# Patient Record
Sex: Male | Born: 1970 | Race: White | Hispanic: No | Marital: Married | Smoking: Never smoker
Health system: Southern US, Community
[De-identification: ages and names within clinical notes are randomized; demographics above are authoritative.]

## PROBLEM LIST (undated history)

## (undated) DIAGNOSIS — K219 Gastro-esophageal reflux disease without esophagitis: Secondary | ICD-10-CM

## (undated) DIAGNOSIS — F32A Depression, unspecified: Secondary | ICD-10-CM

## (undated) DIAGNOSIS — N289 Disorder of kidney and ureter, unspecified: Secondary | ICD-10-CM

## (undated) DIAGNOSIS — G4730 Sleep apnea, unspecified: Secondary | ICD-10-CM

## (undated) DIAGNOSIS — Z87442 Personal history of urinary calculi: Secondary | ICD-10-CM

## (undated) DIAGNOSIS — F419 Anxiety disorder, unspecified: Secondary | ICD-10-CM

## (undated) DIAGNOSIS — F329 Major depressive disorder, single episode, unspecified: Secondary | ICD-10-CM

## (undated) HISTORY — DX: Gastro-esophageal reflux disease without esophagitis: K21.9

## (undated) HISTORY — PX: VASECTOMY: SHX75

---

## 2009-10-30 ENCOUNTER — Ambulatory Visit: Payer: Self-pay | Admitting: General Practice

## 2009-12-03 ENCOUNTER — Emergency Department: Payer: Self-pay | Admitting: Unknown Physician Specialty

## 2009-12-12 ENCOUNTER — Ambulatory Visit: Payer: Self-pay | Admitting: General Practice

## 2017-08-01 ENCOUNTER — Emergency Department
Admission: EM | Admit: 2017-08-01 | Discharge: 2017-08-01 | Disposition: A | Discharge status: Home / Self Care | Payer: BLUE CROSS/BLUE SHIELD | Attending: Emergency Medicine | Admitting: Emergency Medicine

## 2017-08-01 ENCOUNTER — Other Ambulatory Visit: Payer: Self-pay

## 2017-08-01 ENCOUNTER — Encounter: Payer: Self-pay | Admitting: Emergency Medicine

## 2017-08-01 DIAGNOSIS — N23 Unspecified renal colic: Secondary | ICD-10-CM | POA: Diagnosis not present

## 2017-08-01 DIAGNOSIS — R109 Unspecified abdominal pain: Secondary | ICD-10-CM | POA: Diagnosis present

## 2017-08-01 HISTORY — DX: Disorder of kidney and ureter, unspecified: N28.9

## 2017-08-01 LAB — URINALYSIS, COMPLETE (UACMP) WITH MICROSCOPIC
BILIRUBIN URINE: NEGATIVE
Bacteria, UA: NONE SEEN
GLUCOSE, UA: NEGATIVE mg/dL
Ketones, ur: NEGATIVE mg/dL
Leukocytes, UA: NEGATIVE
Nitrite: NEGATIVE
PH: 5 (ref 5.0–8.0)
Protein, ur: 30 mg/dL — AB
RBC / HPF: >50 RBC/hpf — ABNORMAL HIGH (ref 0–5)
SPECIFIC GRAVITY, URINE: 1.025 (ref 1.005–1.030)
Squamous Epithelial / LPF: NONE SEEN (ref 0–5)

## 2017-08-01 MED ORDER — IBUPROFEN 600 MG PO TABS: 600.0000 mg | ORAL_TABLET | Freq: Four times a day (QID) | ORAL | 0 refills | Status: DC | PRN

## 2017-08-01 MED ORDER — OXYCODONE-ACETAMINOPHEN 5-325 MG PO TABS: 1.0000 | ORAL_TABLET | ORAL | 0 refills | Status: AC | PRN

## 2017-08-01 MED ORDER — OXYCODONE-ACETAMINOPHEN 5-325 MG PO TABS
1.0000 | ORAL_TABLET | Freq: Once | ORAL | Status: AC
  Administered 2017-08-01: 1 via ORAL
  Filled 2017-08-01: qty 1

## 2017-08-01 MED ORDER — IBUPROFEN 600 MG PO TABS
600.0000 mg | ORAL_TABLET | Freq: Once | ORAL | Status: AC
  Administered 2017-08-01: 600 mg via ORAL
  Filled 2017-08-01: qty 1

## 2017-08-01 MED ORDER — TAMSULOSIN HCL 0.4 MG PO CAPS: 0.4000 mg | ORAL_CAPSULE | Freq: Every day | ORAL | 0 refills | Status: AC

## 2017-08-01 NOTE — ED Provider Notes (Signed)
[facility] Emergency Department Provider Note ____________________________________________   First MD Initiated Contact with Patient 08/01/17 0725     (approximate)  I have reviewed the triage vital signs and the nursing notes.   HISTORY  Chief Complaint Flank Pain    HPI Derrick [name] is a 47 y.o. male with PMH as noted below including prior history of kidney stones who presents with left lower back pain, acute onset 2 hours ago, now somewhat subsided, and nonradiating.  Patient states it feels identical to prior kidney stone pain.  He does report some dark urine this morning.  He states he has not had a kidney stone in several years.  He felt well until this morning.   Past Medical History:  Diagnosis Date  . Renal disorder    kidney stones    There are no active problems to display for this patient.     Prior to Admission medications   Medication Sig Start Date End Date Taking? Authorizing Provider  ibuprofen (ADVIL,MOTRIN) 600 MG tablet Take 1 tablet (600 mg total) by mouth every 6 (six) hours as needed. 08/01/17   Dionne BucySiadecki, Dalila Arca, MD  oxyCODONE-acetaminophen (PERCOCET) 5-325 MG tablet Take 1 tablet by mouth every 4 (four) hours as needed for up to 3 days for severe pain. 08/01/17 08/04/17  Dionne BucySiadecki, Jachelle Fluty, MD  tamsulosin (FLOMAX) 0.4 MG CAPS capsule Take 1 capsule (0.4 mg total) by mouth daily after supper for 7 days. 08/01/17 08/08/17  Dionne BucySiadecki, Serena Petterson, MD    Allergies Patient has no known allergies.  No family history on file.  Social History Social History   Tobacco Use  . Smoking status: Never Smoker  . Smokeless tobacco: Never Used  Substance Use Topics  . Alcohol use: Yes    Comment: occasionally  . Drug use: Not on file    Review of Systems  Constitutional: No fever. Eyes: No redness. ENT: No sore throat. Cardiovascular: Denies chest pain. Respiratory: Denies shortness of breath. Gastrointestinal: Positive for  nausea. Genitourinary: Positive for hematuria.  Musculoskeletal: Positive for back pain. Skin: Negative for rash. Neurological: Negative for headache.   ____________________________________________   PHYSICAL EXAM:  VITAL SIGNS: ED Triage Vitals  Enc Vitals Group     BP 08/01/17 0706 131/89     Pulse Rate 08/01/17 0706 (!) 45     Resp 08/01/17 0706 18     Temp 08/01/17 0706 97.7 F (36.5 C)     Temp Source 08/01/17 0706 Oral     SpO2 08/01/17 0706 100 %     Weight 08/01/17 0708 195 lb (88.5 kg)     Height 08/01/17 0708 5\' 8"  (1.727 m)     Head Circumference --      Peak Flow --      Pain Score 08/01/17 0708 5     Pain Loc --      Pain Edu? --      Excl. in GC? --     Constitutional: Alert and oriented. Well appearing and in no acute distress. Eyes: Conjunctivae are normal.  Head: Atraumatic. Nose: No congestion/rhinnorhea. Mouth/Throat: Mucous membranes are moist.   Neck: Normal range of motion.  Cardiovascular:  Good peripheral circulation. Respiratory: Normal respiratory effort.  Gastrointestinal: Soft and nontender. No distention.  Genitourinary: No CVA tenderness. Musculoskeletal: Extremities warm and well perfused.  Neurologic:  Normal speech and language. No gross focal neurologic deficits are appreciated.  Skin:  Skin is warm and dry. No rash noted. Psychiatric: Mood and  affect are normal. Speech and behavior are normal.  ____________________________________________   LABS (all labs ordered are listed, but only abnormal results are displayed)  Labs Reviewed  URINALYSIS, COMPLETE (UACMP) WITH MICROSCOPIC - Abnormal; Notable for the following components:      Result Value   Color, Urine YELLOW (*)    APPearance HAZY (*)    Hgb urine dipstick LARGE (*)    Protein, ur 30 (*)    RBC / HPF >50 (*)    All other components within normal limits    ____________________________________________  EKG   ____________________________________________  RADIOLOGY    ____________________________________________   PROCEDURES  Procedure(s) performed: No  Procedures  Critical Care performed: No ____________________________________________   INITIAL IMPRESSION / ASSESSMENT AND PLAN / ED COURSE  Pertinent labs & imaging results that were available during my care of the patient were reviewed by me and considered in my medical decision making (see chart for details).  46 year old male with PMH as noted above presents with left lower back pain and dark urine since this morning, and states the pain is identical to pain from prior ureteral stones.  He states it significantly improved while he was en route to the hospital.  On exam, the patient is well-appearing, vital signs are normal, and the remainder of the exam is unremarkable.  UA shows RBCs but no other concerning findings.  Presentation is consistent with ureteral stone.  The patient is comfortable now and I suspect it may have passed.  We will observe the patient for about an hour and reassess.  If he continues to be comfortable, I anticipate discharge home.  No indication for imaging at this time.  ----------------------------------------- 9:08 AM on 08/01/2017 -----------------------------------------  The patient has remained comfortable, and has not required additional pain medication.  He feels well to go home.  At this time there continues to be no indication for imaging.  I gave the patient return precautions, and have prescribed a pain medication and Flomax.  He expresses understanding.   ____________________________________________   FINAL CLINICAL IMPRESSION(S) / ED DIAGNOSES  Final diagnoses:  Ureteral colic      NEW MEDICATIONS STARTED DURING THIS VISIT:  New Prescriptions   IBUPROFEN (ADVIL,MOTRIN) 600 MG TABLET    Take 1 tablet (600 mg total) by  mouth every 6 (six) hours as needed.   OXYCODONE-ACETAMINOPHEN (PERCOCET) 5-325 MG TABLET    Take 1 tablet by mouth every 4 (four) hours as needed for up to 3 days for severe pain.   TAMSULOSIN (FLOMAX) 0.4 MG CAPS CAPSULE    Take 1 capsule (0.4 mg total) by mouth daily after supper for 7 days.     Note:  This document was prepared using Dragon voice recognition software and may include unintentional dictation errors.    Dionne Bucy, MD 08/01/17 [phone]

## 2017-08-01 NOTE — ED Notes (Signed)
.   Pt is resting, Respirations even and unlabored, NAD. Stretcher lowest postion and locked. Call bell within reach. Denies any needs at this time RN will continue to monitor.  Family at bedside. Warm blanket provided.

## 2017-08-01 NOTE — ED Notes (Signed)
Pt presents with left sided flank pain, pt states he believes it is a kidney stone as he has had them in the past and the feeling is similar. Family at bedside, awaiting EDP at this time.

## 2017-08-01 NOTE — ED Notes (Signed)
.   Pt is resting, Respirations even and unlabored, NAD. Stretcher lowest postion and locked. Call bell within reach. Denies any needs at this time RN will continue to monitor.  Family at bedside.  

## 2017-08-01 NOTE — ED Triage Notes (Signed)
Patient presents to the ED with left flank pain.  Patient is also complaining of nausea, diarrhea and diaphoresis.  Patient reports history of kidney stones and states this feels similar.  Patient states symptoms began at 5am.

## 2017-08-01 NOTE — ED Notes (Signed)
[facility] Urine was collected in triage.

## 2017-08-01 NOTE — Discharge Instructions (Addendum)
Take the ibuprofen as the primary pain relief and the Percocet only if needed for more severe pain.  Return to the ER for new, worsening, or persistent severe pain, vomiting, weakness, or any other new or worsening symptoms that concern you.

## 2017-11-13 ENCOUNTER — Other Ambulatory Visit: Payer: Self-pay | Admitting: Family Medicine

## 2017-11-13 DIAGNOSIS — R319 Hematuria, unspecified: Secondary | ICD-10-CM

## 2017-11-13 DIAGNOSIS — R109 Unspecified abdominal pain: Secondary | ICD-10-CM

## 2017-11-17 ENCOUNTER — Ambulatory Visit
Admission: RE | Admit: 2017-11-17 | Discharge: 2017-11-17 | Disposition: A | Discharge status: Home / Self Care | Payer: BLUE CROSS/BLUE SHIELD | Source: Ambulatory Visit | Attending: Family Medicine | Admitting: Family Medicine

## 2017-11-17 DIAGNOSIS — R109 Unspecified abdominal pain: Secondary | ICD-10-CM | POA: Insufficient documentation

## 2017-11-17 DIAGNOSIS — R319 Hematuria, unspecified: Secondary | ICD-10-CM | POA: Diagnosis not present

## 2017-12-16 NOTE — Progress Notes (Signed)
12/17/17 4:59 PM   Derrick [name] 02-26-1970 [id number]  Referring provider: Joni Reining, PA-C [address] [place], [place] [address]  Chief Complaint  Patient presents with  . Hematuria    New Patient    HPI: Derrick [name] is a 47 yo M who presents today for management and evaluation of urinary symptoms, flank pain and microscopic hematuria.  Patient reports today that he was seen and evaluated in the emergency room on July 5th at [facility]. He reports of a sudden acute onset of flank plain with associated nausea.  His urinalysis at the time showed blood.  No imaging was obtained but he was treated for presumed kidney stone.  His pain subsided later that evening and has had no recurrence of left flank pain.  He has a personal history of kidney stones and this is consistent with his previous stone episodes.  He never did see the stone pass however.  More recently, the patient began to experience urinary symptoms about a month ago which include urinary frequency, urgency, dysuria.  He is also noted to have microscopic blood on his urinalysis performed at his annual physical examination.  He is concerned that he may be having a stone episode again.  He does not have any associated flank pain on this occasion.  As part of his work-up, he underwent renal ultrasound on 11/18/2017 which was unremarkable, no evidence of hydronephrosis or stones.  His kidneys were normal.  At this time, he was started on Flomax again for presumed kidney stones and his symptoms improved.  He since stopped this medication and his symptoms have again worsened.  Prior to October, he had absolutely no urinary symptoms at baseline.  He had excellent stream without urgency or frequency.  Notably today, he does have persistent microscopic blood, 3-10 red blood cells per high-powered field in the absence of infection.   He denies smoking, any exposure to second hand smoke, and any possible benzene exposure. Pt  reports of chewing tobacco and is regularly screen for oral cancer.    IPSS    Row Name 12/17/17 1500         International Prostate Symptom Score   How often have you had the sensation of not emptying your bladder?  About half the time     How often have you had to urinate less than every two hours?  Less than half the time     How often have you found you stopped and started again several times when you urinated?  Less than half the time     How often have you found it difficult to postpone urination?  Less than half the time     How often have you had a weak urinary stream?  Less than half the time     How often have you had to strain to start urination?  Less than half the time     How many times did you typically get up at night to urinate?  2 Times     Total IPSS Score  15       Quality of Life due to urinary symptoms   If you were to spend the rest of your life with your urinary condition just the way it is now how would you feel about that?  Terrible        Score:  1-7 Mild 8-19 Moderate 20-35 Severe   PMH: Past Medical History:  Diagnosis Date  . Renal disorder  kidney stones    Surgical History: History reviewed. No pertinent surgical history.  Home Medications:  Allergies as of 12/17/2017   No Known Allergies     Medication List        Accurate as of 12/17/17  4:59 PM. Always use your most recent med list.          citalopram 40 MG tablet Commonly known as:  CELEXA TK 1 T PO ONCE D   ibuprofen 600 MG tablet Commonly known as:  ADVIL,MOTRIN Take 1 tablet (600 mg total) by mouth every 6 (six) hours as needed.   tamsulosin 0.4 MG Caps capsule Commonly known as:  FLOMAX Take 1 capsule (0.4 mg total) by mouth daily.       Allergies: No Known Allergies  Family History: Family History  Problem Relation Age of Onset  . Renal cancer Neg Hx   . Prostate cancer Neg Hx     Social History:  reports that he has never smoked. He has never used  smokeless tobacco. He reports that he drinks alcohol. His drug history is not on file.  ROS: UROLOGY Frequent Urination?: Yes Hard to postpone urination?: No Burning/pain with urination?: Yes Get up at night to urinate?: Yes Leakage of urine?: No Urine stream starts and stops?: No Trouble starting stream?: No Do you have to strain to urinate?: No Blood in urine?: Yes Urinary tract infection?: No Sexually transmitted disease?: No Injury to kidneys or bladder?: No Painful intercourse?: No Weak stream?: Yes Erection problems?: No Penile pain?: No  Gastrointestinal Nausea?: No Vomiting?: No Indigestion/heartburn?: No Diarrhea?: No Constipation?: No  Constitutional Fever: No Night sweats?: No Weight loss?: No Fatigue?: No  Skin Skin rash/lesions?: No Itching?: No  Eyes Blurred vision?: No Double vision?: No  Ears/Nose/Throat Sore throat?: No Sinus problems?: No  Hematologic/Lymphatic Swollen glands?: No Easy bruising?: No  Cardiovascular Leg swelling?: No Chest pain?: No  Respiratory Cough?: No Shortness of breath?: No  Endocrine Excessive thirst?: No  Musculoskeletal Back pain?: No Joint pain?: No  Neurological Headaches?: No Dizziness?: No  Psychologic Depression?: No Anxiety?: No  Physical Exam: BP 118/71   Pulse 80   Ht 5\' 9"  (1.753 m)   Wt 195 lb (88.5 kg)   BMI 28.80 kg/m   Constitutional:  Alert and oriented, No acute distress. HEENT: [place] AT, moist mucus membranes.  Trachea midline, no masses. Cardiovascular: No clubbing, cyanosis, or edema. Respiratory: Normal respiratory effort, no increased work of breathing. Rectal: external hemmohoid, normal sphinter tone, slight tenderness 30cc prostate, no nodules   Skin: No rashes, bruises or suspicious lesions. Neurologic: Grossly intact, no focal deficits, moving all 4 extremities. Psychiatric: Normal mood and affect.  Laboratory Data: UA 3-10 RBC, otherwise unremarkable   1.13  creatine on 9/19  PSA is 0.7 09/2017  Urinalysis UA reviewed today, 3-10 red blood cells per high-power field, otherwise unremarkable  Pertinent Imaging: Results for orders placed or performed in visit on 12/17/17  BLADDER SCAN AMB NON-IMAGING  Result Value Ref Range   Scan Result 9ml    Results for orders placed during the hospital encounter of 11/17/17  US RENAL   Narrative CLINICAL DATA:  47 year old male with microscopic hematuria. Flank pain 1 week.  EXAM: RENAL / URINARY TRACT ULTRASOUND COMPLETE  COMPARISON:  Noncontrast CT Abdomen and Pelvis 12/03/2009.  FINDINGS: Right Kidney:  Length: 10.8 centimeters. Echogenicity within normal limits. No mass or hydronephrosis visualized.  Left Kidney:  Length: 11.0 centimeters. Echogenicity within normal limits. No mass  or hydronephrosis visualized.  Bladder:  Appears normal for degree of bladder distention. Both ureteral jets detected with Doppler.  IMPRESSION: Normal ultrasound appearance of both kidneys and the urinary bladder.  No nephrolithiasis is evident by ultrasound.   Electronically Signed   By: Odessa Fleming M.D.   On: 11/18/2017 08:38    Renal / Urinary Tract [place] personally reviewed today and with the pt.   Assessment & Plan:    1.Prostatis Current symptoms along with microscopic blood and tenderness on rectal exam today are most consistent with prostatitis as underlying etiology I recommended supportive care as well as a course of NSAIDs with tapering as well as Flomax  2. Microscopic hematuria We discussed the differential diagnosis for microscopic hematuria including nephrolithiasis, renal or upper tract tumors, bladder stones, UTIs, or bladder tumors as well as undetermined etiologies. Per AUA guidelines, I did recommend complete microscopic hematuria evaluation including CTU, possible urine cytology, and office cystoscopy.  Follow-up in 1 month for cysto.  3. History of kidney stones  R/o  stones with work-up for number 2   Return in about 4 weeks (around 01/14/2018) for cysto/ f/u CT scan.  [facility] [address], [address] [place], [place] [address] [phone]  I, Donne Hazel, am acting as a scribe for Dr. Vanna Scotland,  I have reviewed the above documentation for accuracy and completeness, and I agree with the above.   Vanna Scotland, MD

## 2017-12-17 ENCOUNTER — Encounter: Payer: Self-pay | Admitting: Urology

## 2017-12-17 ENCOUNTER — Ambulatory Visit (INDEPENDENT_AMBULATORY_CARE_PROVIDER_SITE_OTHER): Payer: BLUE CROSS/BLUE SHIELD | Admitting: Urology

## 2017-12-17 VITALS — BP 118/71 | HR 80 | Ht 69.0 in | Wt 195.0 lb

## 2017-12-17 DIAGNOSIS — Z87448 Personal history of other diseases of urinary system: Secondary | ICD-10-CM

## 2017-12-17 DIAGNOSIS — R109 Unspecified abdominal pain: Secondary | ICD-10-CM | POA: Diagnosis not present

## 2017-12-17 DIAGNOSIS — R10A2 Flank pain, left side: Secondary | ICD-10-CM

## 2017-12-17 DIAGNOSIS — R3129 Other microscopic hematuria: Secondary | ICD-10-CM

## 2017-12-17 LAB — BLADDER SCAN AMB NON-IMAGING

## 2017-12-17 MED ORDER — TAMSULOSIN HCL 0.4 MG PO CAPS: 0.4000 mg | ORAL_CAPSULE | Freq: Every day | ORAL | 11 refills | Status: DC

## 2017-12-17 NOTE — Patient Instructions (Signed)
NSAIDS (ibuprofen) 800 mg three times per day x 1 week  Then taper back to 400 mg 3x day for 1 week  Then 200 mg three times day for 1 week

## 2017-12-18 LAB — URINALYSIS, COMPLETE
Bilirubin, UA: NEGATIVE
GLUCOSE, UA: NEGATIVE
Ketones, UA: NEGATIVE
Leukocytes, UA: NEGATIVE
NITRITE UA: NEGATIVE
PH UA: 7 (ref 5.0–7.5)
Protein, UA: NEGATIVE
Specific Gravity, UA: 1.02 (ref 1.005–1.030)
UUROB: 1 mg/dL (ref 0.2–1.0)

## 2017-12-18 LAB — MICROSCOPIC EXAMINATION: EPITHELIAL CELLS (NON RENAL): NONE SEEN /HPF (ref 0–10)

## 2018-01-01 ENCOUNTER — Ambulatory Visit
Admission: RE | Admit: 2018-01-01 | Discharge: 2018-01-01 | Disposition: A | Discharge status: Home / Self Care | Payer: BLUE CROSS/BLUE SHIELD | Source: Ambulatory Visit | Attending: Urology | Admitting: Urology

## 2018-01-01 DIAGNOSIS — R3129 Other microscopic hematuria: Secondary | ICD-10-CM | POA: Diagnosis present

## 2018-01-01 MED ORDER — IOPAMIDOL (ISOVUE-300) INJECTION 61%
125.0000 mL | Freq: Once | INTRAVENOUS | Status: AC | PRN
  Administered 2018-01-01: 125 mL via INTRAVENOUS

## 2018-01-07 ENCOUNTER — Telehealth: Payer: Self-pay | Admitting: Radiology

## 2018-01-07 ENCOUNTER — Encounter: Payer: Self-pay | Admitting: Urology

## 2018-01-07 ENCOUNTER — Other Ambulatory Visit: Payer: Self-pay

## 2018-01-07 ENCOUNTER — Other Ambulatory Visit: Payer: Self-pay | Admitting: Radiology

## 2018-01-07 ENCOUNTER — Ambulatory Visit: Payer: BLUE CROSS/BLUE SHIELD | Admitting: Urology

## 2018-01-07 DIAGNOSIS — Z87448 Personal history of other diseases of urinary system: Secondary | ICD-10-CM

## 2018-01-07 DIAGNOSIS — N201 Calculus of ureter: Secondary | ICD-10-CM | POA: Diagnosis not present

## 2018-01-07 DIAGNOSIS — N200 Calculus of kidney: Secondary | ICD-10-CM

## 2018-01-07 LAB — URINALYSIS, COMPLETE
Bilirubin, UA: NEGATIVE
Glucose, UA: NEGATIVE
KETONES UA: NEGATIVE
Leukocytes, UA: NEGATIVE
Nitrite, UA: NEGATIVE
PROTEIN UA: NEGATIVE
SPEC GRAV UA: 1.02 (ref 1.005–1.030)
Urobilinogen, Ur: 0.2 mg/dL (ref 0.2–1.0)
pH, UA: 5.5 (ref 5.0–7.5)

## 2018-01-07 LAB — MICROSCOPIC EXAMINATION
Bacteria, UA: NONE SEEN
Epithelial Cells (non renal): NONE SEEN /hpf (ref 0–10)
WBC UA: NONE SEEN /HPF (ref 0–5)

## 2018-01-07 NOTE — Progress Notes (Signed)
 01/07/2018 2:59 PM   Derrick [name] 04/30/1970 [id number]  Referring provider: No referring provider defined for this encounter.  Chief Complaint  Patient presents with  . Follow-up    HPI: Derrick [name] is a 47 yo M presents today for the evaluation and management of personal history of microscopic hematuria, prostatis and a history of kidney stones. His last visit with us was on 12/17/2017.   Pt was scheduled for cystoscopy today but due to recent CT result found to have a ureteral stone.    Pt reports of a burning sensation while urinating which started mid-late October.   Please see previous note for details.  His symptoms remain stable and unimproved since last visit.     He had an episode of left flank pain dating back to July with presumed stone and his pain pass.    PMH: Past Medical History:  Diagnosis Date  . Renal disorder    kidney stones    Surgical History: No past surgical history on file.  Home Medications:  Allergies as of 01/07/2018   No Known Allergies     Medication List        Accurate as of 01/07/18  2:59 PM. Always use your most recent med list.          citalopram 40 MG tablet Commonly known as:  CELEXA TK 1 T PO ONCE D   tamsulosin 0.4 MG Caps capsule Commonly known as:  FLOMAX Take 1 capsule (0.4 mg total) by mouth daily.       Allergies: No Known Allergies  Family History: Family History  Problem Relation Age of Onset  . Renal cancer Neg Hx   . Prostate cancer Neg Hx     Social History:  reports that he has never smoked. He has never used smokeless tobacco. He reports that he drinks alcohol. His drug history is not on file.  ROS: UROLOGY Frequent Urination?: Yes Hard to postpone urination?: No Burning/pain with urination?: Yes Get up at night to urinate?: Yes Leakage of urine?: No Urine stream starts and stops?: No Trouble starting stream?: No Do you have to strain to urinate?: No Blood in urine?: Yes Urinary  tract infection?: No Sexually transmitted disease?: No Injury to kidneys or bladder?: No Painful intercourse?: No Weak stream?: Yes Erection problems?: No Penile pain?: No  Gastrointestinal Nausea?: No Vomiting?: No Indigestion/heartburn?: No Diarrhea?: No Constipation?: No  Constitutional Fever: No Night sweats?: No Weight loss?: No Fatigue?: No  Skin Skin rash/lesions?: No Itching?: No  Eyes Blurred vision?: No Double vision?: No  Ears/Nose/Throat Sore throat?: No Sinus problems?: No  Hematologic/Lymphatic Swollen glands?: No Easy bruising?: No  Cardiovascular Leg swelling?: No Chest pain?: No  Respiratory Cough?: No Shortness of breath?: No  Endocrine Excessive thirst?: No  Musculoskeletal Back pain?: No Joint pain?: No  Neurological Headaches?: No Dizziness?: No  Psychologic Depression?: No Anxiety?: No  Physical Exam: Constitutional:  Alert and oriented, No acute distress. HEENT: [place] AT, moist mucus membranes.  Trachea midline, no masses. Cardiovascular: No clubbing, cyanosis, or edema. Respiratory: Normal respiratory effort, no increased work of breathing. Skin: No rashes, bruises or suspicious lesions. Neurologic: Grossly intact, no focal deficits, moving all 4 extremities. Psychiatric: Normal mood and affect.  Urinalysis His UA is negative.   Pertinent Imaging: Results for orders placed during the hospital encounter of 01/01/18  CT HEMATURIA WORKUP   Narrative CLINICAL DATA:  Patient with history of renal stones. Low back pain.  EXAM: CT   ABDOMEN AND PELVIS WITHOUT AND WITH CONTRAST  TECHNIQUE: Multidetector CT imaging of the abdomen and pelvis was performed following the standard protocol before and following the bolus administration of intravenous contrast.  CONTRAST:  125mL ISOVUE-300 IOPAMIDOL (ISOVUE-300) INJECTION 61%  COMPARISON:  CT abdomen pelvis 12/03/2009.  FINDINGS: Lower chest: Normal heart size. Lung bases  are clear. No pleural effusion.  Hepatobiliary: Liver is normal in size and contour. There's a 1.2 cm cyst right hepatic lobe (image 20; series 9). Gallbladder is decompressed. No intrahepatic or extrahepatic biliary ductal dilatation.  Pancreas: Unremarkable  Spleen: Unremarkable  Adrenals/Urinary Tract: Normal adrenal glands. Kidneys enhance symmetrically with contrast. No suspicious enhancing renal mass identified. Noncontrast images demonstrate multiple stones within the left kidney measuring up to 5 mm within the interpolar region (image 91; series 5). Mild left hydroureteronephrosis. There is a 5 mm stone just within the left aspect of the urinary bladder at the left UVJ (image 72; series 2). Urinary bladder is unremarkable. Delayed images demonstrate excretion of contrast material into the bilateral renal collecting systems and ureters. No filling defects are identified. Stone is demonstrated at the distal left ureter at the UVJ.  Stomach/Bowel: Sigmoid colonic diverticulosis. No CT evidence for acute diverticulitis. Normal appendix. Normal morphology of the stomach. No free fluid or free intraperitoneal air.  Vascular/Lymphatic: Normal caliber abdominal aorta. No retroperitoneal lymphadenopathy.  Reproductive: Prostate unremarkable.  Other: None.  Musculoskeletal: Lumbar spine degenerative changes. No aggressive or acute appearing osseous lesions.  IMPRESSION: 1. There is a 5 mm stone within the distal left ureter just into the urinary bladder at the UVJ resulting in mild left hydroureteronephrosis. 2. Multiple additional left-sided renal stones. 3. No suspicious enhancing renal mass. 4. No abnormal filling defects within the opacified renal collecting systems and ureters. 5. These results will be called to the ordering clinician or representative by the Radiologist Assistant, and communication documented in the PACS or zVision Dashboard.   Electronically  Signed   By: Drew  Davis M.D.   On: 01/02/2018 08:32    I have reviewed CT personally and with pt.   Assessment & Plan:    1.  Left ureteral stone -CT scan on 01/02/2018 showed 5 mm left ureteral stone obstruction -Given the time that he failed to pass the stone, it may not pass spontaneously   -We discussed various treatment options including ESWL vs. ureteroscopy, laser lithotripsy, and stent.  -We discussed the risks and benefits of both including bleeding, infection, damage to surrounding structures, efficacy with need for possible further intervention, and need for temporary ureteral stent. -Pt elected ureteroscopy with goal of treating ureteral and kidney stones  2. Left Kidney stones -See above, will address at time of surgery   Elmin Wiederholt, MD  [facility] [address], [address] [place], [place] [address] [phone]  I, Nethusan Sivanesan, am acting as a scribe for Dr. Garland Smouse,  I have reviewed the above documentation for accuracy and completeness, and I agree with the above.   Haeven Nickle, MD    

## 2018-01-07 NOTE — Telephone Encounter (Signed)
Patient was given the [facility] Surgery Information form below as well as the Instructions for Pre-Admission Testing form & a map of [facility].   [address] [facility], [address] [place], [place] [address] Telephone: [phone] Fax: [phone]   Thank you for choosing [facility] for your upcoming surgery!  We are always here to assist in your urological needs.  Please read the following information with specific details for your upcoming appointments related to your surgery. Please contact Lion Fernandez at [phone] Option 3 with any questions.  The Name of Your Surgery: Left ureteroscopy, laser lithotripsy, left ureteral stent placement Your Surgery Date: 01/14/2018 Your Surgeon: Vanna ScotlandAshley Brandon  Please call Same Day Surgery at [phone] between the hours of 1pm-3pm one day prior to your surgery. They will inform you of the time to arrive at Same Day Surgery which is located on the second floor of the [facility].   Please refer to the attached letter regarding instructions for Pre-Admission Testing. You will receive a call from the Pre-Admission Testing office regarding your appointment with them.  The Pre-Admission Testing office is located at [address], on the first floor of the [facility] Arts Center at [facility] in [address] (office is to the right as you enter through the [facility]). Please have all medications you are currently taking and your insurance card available.   Patient was advised to have nothing to eat or drink after midnight the night prior to surgery except that he may have only water until 2 hours before surgery with nothing to drink within 2 hours of surgery.  The patient states he currently takes no blood thinners. Patient's questions were answered and he expressed understanding of these instructions.

## 2018-01-07 NOTE — H&P (View-Only) (Signed)
01/07/2018 2:59 PM   Derrick [name] [birth year] [id number]  Referring provider: No referring provider defined for this encounter.  Chief Complaint  Patient presents with  . Follow-up    HPI: Derrick [name] is a 47 yo M presents today for the evaluation and management of personal history of microscopic hematuria, prostatis and a history of kidney stones. His last visit with [place] was on 12/17/2017.   Pt was scheduled for cystoscopy today but due to recent CT result found to have a ureteral stone.    Pt reports of a burning sensation while urinating which started mid-late October.   Please see previous note for details.  His symptoms remain stable and unimproved since last visit.     He had an episode of left flank pain dating back to July with presumed stone and his pain pass.    PMH: Past Medical History:  Diagnosis Date  . Renal disorder    kidney stones    Surgical History: No past surgical history on file.  Home Medications:  Allergies as of 01/07/2018   No Known Allergies     Medication List        Accurate as of 01/07/18  2:59 PM. Always use your most recent med list.          citalopram 40 MG tablet Commonly known as:  CELEXA TK 1 T PO ONCE D   tamsulosin 0.4 MG Caps capsule Commonly known as:  FLOMAX Take 1 capsule (0.4 mg total) by mouth daily.       Allergies: No Known Allergies  Family History: Family History  Problem Relation Age of Onset  . Renal cancer Neg Hx   . Prostate cancer Neg Hx     Social History:  reports that he has never smoked. He has never used smokeless tobacco. He reports that he drinks alcohol. His drug history is not on file.  ROS: UROLOGY Frequent Urination?: Yes Hard to postpone urination?: No Burning/pain with urination?: Yes Get up at night to urinate?: Yes Leakage of urine?: No Urine stream starts and stops?: No Trouble starting stream?: No Do you have to strain to urinate?: No Blood in urine?: Yes Urinary  tract infection?: No Sexually transmitted disease?: No Injury to kidneys or bladder?: No Painful intercourse?: No Weak stream?: Yes Erection problems?: No Penile pain?: No  Gastrointestinal Nausea?: No Vomiting?: No Indigestion/heartburn?: No Diarrhea?: No Constipation?: No  Constitutional Fever: No Night sweats?: No Weight loss?: No Fatigue?: No  Skin Skin rash/lesions?: No Itching?: No  Eyes Blurred vision?: No Double vision?: No  Ears/Nose/Throat Sore throat?: No Sinus problems?: No  Hematologic/Lymphatic Swollen glands?: No Easy bruising?: No  Cardiovascular Leg swelling?: No Chest pain?: No  Respiratory Cough?: No Shortness of breath?: No  Endocrine Excessive thirst?: No  Musculoskeletal Back pain?: No Joint pain?: No  Neurological Headaches?: No Dizziness?: No  Psychologic Depression?: No Anxiety?: No  Physical Exam: Constitutional:  Alert and oriented, No acute distress. HEENT: [place] AT, moist mucus membranes.  Trachea midline, no masses. Cardiovascular: No clubbing, cyanosis, or edema. Respiratory: Normal respiratory effort, no increased work of breathing. Skin: No rashes, bruises or suspicious lesions. Neurologic: Grossly intact, no focal deficits, moving all 4 extremities. Psychiatric: Normal mood and affect.  Urinalysis His UA is negative.   Pertinent Imaging: Results for orders placed during the hospital encounter of 01/01/18  CT HEMATURIA WORKUP   Narrative CLINICAL DATA:  Patient with history of renal stones. Low back pain.  EXAM: CT  ABDOMEN AND PELVIS WITHOUT AND WITH CONTRAST  TECHNIQUE: Multidetector CT imaging of the abdomen and pelvis was performed following the standard protocol before and following the bolus administration of intravenous contrast.  CONTRAST:  125mL ISOVUE-300 IOPAMIDOL (ISOVUE-300) INJECTION 61%  COMPARISON:  CT abdomen pelvis 12/03/2009.  FINDINGS: Lower chest: Normal heart size. Lung bases  are clear. No pleural effusion.  Hepatobiliary: Liver is normal in size and contour. There's a 1.2 cm cyst right hepatic lobe (image 20; series 9). Gallbladder is decompressed. No intrahepatic or extrahepatic biliary ductal dilatation.  Pancreas: Unremarkable  Spleen: Unremarkable  Adrenals/Urinary Tract: Normal adrenal glands. Kidneys enhance symmetrically with contrast. No suspicious enhancing renal mass identified. Noncontrast images demonstrate multiple stones within the left kidney measuring up to 5 mm within the interpolar region (image 91; series 5). Mild left hydroureteronephrosis. There is a 5 mm stone just within the left aspect of the urinary bladder at the left UVJ (image 72; series 2). Urinary bladder is unremarkable. Delayed images demonstrate excretion of contrast material into the bilateral renal collecting systems and ureters. No filling defects are identified. Stone is demonstrated at the distal left ureter at the UVJ.  Stomach/Bowel: Sigmoid colonic diverticulosis. No CT evidence for acute diverticulitis. Normal appendix. Normal morphology of the stomach. No free fluid or free intraperitoneal air.  Vascular/Lymphatic: Normal caliber abdominal aorta. No retroperitoneal lymphadenopathy.  Reproductive: Prostate unremarkable.  Other: None.  Musculoskeletal: Lumbar spine degenerative changes. No aggressive or acute appearing osseous lesions.  IMPRESSION: 1. There is a 5 mm stone within the distal left ureter just into the urinary bladder at the UVJ resulting in mild left hydroureteronephrosis. 2. Multiple additional left-sided renal stones. 3. No suspicious enhancing renal mass. 4. No abnormal filling defects within the opacified renal collecting systems and ureters. 5. These results will be called to the ordering clinician or representative by the Radiologist Assistant, and communication documented in the PACS or zVision Dashboard.   Electronically  Signed   By: Annia Beltrew  Davis M.D.   On: 01/02/2018 08:32    I have reviewed CT personally and with pt.   Assessment & Plan:    1.  Left ureteral stone -CT scan on 01/02/2018 showed 5 mm left ureteral stone obstruction -Given the time that he failed to pass the stone, it may not pass spontaneously   -We discussed various treatment options including ESWL vs. ureteroscopy, laser lithotripsy, and stent.  -We discussed the risks and benefits of both including bleeding, infection, damage to surrounding structures, efficacy with need for possible further intervention, and need for temporary ureteral stent. -Pt elected ureteroscopy with goal of treating ureteral and kidney stones  2. Left Kidney stones -See above, will address at time of surgery   Vanna ScotlandAshley Feather Berrie, MD  [facility] [address], [address] [place], [place] [address] [phone]  I, Donne Hazelethusan Sivanesan, am acting as a scribe for Dr. Vanna ScotlandAshley Dealie Koelzer,  I have reviewed the above documentation for accuracy and completeness, and I agree with the above.   Vanna ScotlandAshley Emonnie Cannady, MD

## 2018-01-12 LAB — CULTURE, URINE COMPREHENSIVE

## 2018-01-13 ENCOUNTER — Telehealth: Payer: Self-pay | Admitting: Urology

## 2018-01-13 ENCOUNTER — Encounter
Admission: RE | Admit: 2018-01-13 | Discharge: 2018-01-13 | Disposition: A | Discharge status: Home / Self Care | Payer: BLUE CROSS/BLUE SHIELD | Source: Ambulatory Visit | Attending: Urology | Admitting: Urology

## 2018-01-13 ENCOUNTER — Other Ambulatory Visit: Payer: Self-pay

## 2018-01-13 HISTORY — DX: Sleep apnea, unspecified: G47.30

## 2018-01-13 HISTORY — DX: Major depressive disorder, single episode, unspecified: F32.9

## 2018-01-13 HISTORY — DX: Depression, unspecified: F32.A

## 2018-01-13 HISTORY — DX: Personal history of urinary calculi: Z87.442

## 2018-01-13 HISTORY — DX: Anxiety disorder, unspecified: F41.9

## 2018-01-13 MED ORDER — CEFAZOLIN SODIUM-DEXTROSE 2-4 GM/100ML-% IV SOLN
2.0000 g | INTRAVENOUS | Status: AC
  Administered 2018-01-14: 2 g via INTRAVENOUS

## 2018-01-13 NOTE — Telephone Encounter (Signed)
Recommend ibuprofen alternating with Tylenol.  If he needs narcotics, he can stop by our office for a few tablet prescription.  Vanna ScotlandAshley Dalayza Zambrana, MD

## 2018-01-13 NOTE — Telephone Encounter (Signed)
Spoke with pt who stated as of right now his pain has somewhat subsided so for right now he thinks he is somewhat ok. Per pt pre admit told him he can not take tylenol or ibuprofen. He had no additional questions at this time.   Dr. Apolinar JunesBrandon this is a FYI

## 2018-01-13 NOTE — Patient Instructions (Signed)
[facility] on the 2nd floor of the Medical Mall. To find out your arrival time, please call [phone] between 1PM - 3PM on: Tuesday January 13, 2018  REMEMBER: Instructions that are not followed completely may result in serious medical risk, up to and including death; or upon the discretion of your surgeon and anesthesiologist your surgery may need to be rescheduled.  Do not eat food after midnight the night before surgery.  No gum chewing, lozengers or hard candies.  You may however, drink CLEAR liquids up to 2 hours before you are scheduled to arrive for your surgery. Do not drink anything within 2 hours of the start of your surgery.  Clear liquids include: - water  - apple juice without pulp -CLEAR  gatorade - black coffee or tea (Do NOT add milk or creamers to the coffee or tea) Do NOT drink anything that is not on this list.  Type 1 and Type 2 diabetics should only drink water.  No Alcohol for 24 hours before or after surgery.  No Smoking including e-cigarettes for 24 hours prior to surgery.  No chewable tobacco products for at least 6 hours prior to surgery.  No nicotine patches on the day of surgery.  On the morning of surgery brush your teeth with toothpaste and water, you may rinse your mouth with mouthwash if you wish. Do not swallow any toothpaste or mouthwash.  Notify your doctor if there is any change in your medical condition (cold, fever, infection).  Do not wear jewelry, make-up, hairpins, clips or nail polish.  Do not wear lotions, powders, or perfumes.   Do not shave 48 hours prior to surgery.   Contacts and dentures may not be worn into surgery.  Do not bring valuables to the hospital, including drivers license, insurance or credit cards.  [facility] is not responsible for any belongings or valuables.   TAKE THESE MEDICATIONS THE MORNING OF SURGERY: CELEXA FLOMAX  SHOWER  MORNING OF SURGERY  Bring your C-PAP to the hospital with you in case you may have to spend the night.   Follow recommendations from Cardiologist, Pulmonologist or PCP regarding stopping Aspirin, Coumadin, Plavix, Eliquis, Pradaxa, or Pletal.  Stop Anti-inflammatories (NSAIDS) such as Advil, Aleve, Ibuprofen, Motrin, Naproxen, Naprosyn and Aspirin based products such as Excedrin, Goodys Powder, BC Powder. (May take Tylenol or Acetaminophen if needed.)  Stop ANY OVER THE COUNTER supplements until after surgery. (May continue Vitamin D, Vitamin B, and multivitamin.)  Wear comfortable clothing (specific to your surgery type) to the hospital.  Plan for stool softeners for home use.  If you are being discharged the day of surgery, you will not be allowed to drive home. You will need a responsible adult to drive you home and stay with you that night.   If you are taking public transportation, you will need to have a responsible adult with you. Please confirm with your physician that it is acceptable to use public transportation.   Please call [phone] if you have any questions about these instructions.

## 2018-01-13 NOTE — Telephone Encounter (Signed)
Patient is scheduled for a cystoscopy/ureterscopy/holmium laser/stent placement with Dr. Apolinar JunesBrandon on 01/14/18.    He called the office today asking for information on what he can take for pain.  He is experiencing pressure, nausea, low back pain.  He is scheduled for a pre-admit appointment today at 11:45am and will be in the building.  He can be reached at [phone].

## 2018-01-13 NOTE — Telephone Encounter (Signed)
Let him know that I am fine with tylenol or ibuprofen.  Derrick ScotlandAshley Beda Dula, MD

## 2018-01-14 ENCOUNTER — Ambulatory Visit: Payer: BLUE CROSS/BLUE SHIELD | Admitting: Anesthesiology

## 2018-01-14 ENCOUNTER — Other Ambulatory Visit: Payer: Self-pay

## 2018-01-14 ENCOUNTER — Ambulatory Visit
Admission: RE | Admit: 2018-01-14 | Discharge: 2018-01-14 | Disposition: A | Discharge status: Home / Self Care | Payer: BLUE CROSS/BLUE SHIELD | Attending: Urology | Admitting: Urology

## 2018-01-14 ENCOUNTER — Encounter
Admission: RE | Disposition: A | Discharge status: Home / Self Care | Payer: Self-pay | Source: Home / Self Care | Attending: Urology

## 2018-01-14 DIAGNOSIS — G4730 Sleep apnea, unspecified: Secondary | ICD-10-CM | POA: Insufficient documentation

## 2018-01-14 DIAGNOSIS — F329 Major depressive disorder, single episode, unspecified: Secondary | ICD-10-CM | POA: Insufficient documentation

## 2018-01-14 DIAGNOSIS — N201 Calculus of ureter: Secondary | ICD-10-CM

## 2018-01-14 DIAGNOSIS — N132 Hydronephrosis with renal and ureteral calculous obstruction: Secondary | ICD-10-CM | POA: Insufficient documentation

## 2018-01-14 HISTORY — PX: CYSTOSCOPY/URETEROSCOPY/HOLMIUM LASER/STENT PLACEMENT: SHX6546

## 2018-01-14 LAB — URINE DRUG SCREEN, QUALITATIVE (ARMC ONLY)
Amphetamines, Ur Screen: NOT DETECTED
Barbiturates, Ur Screen: NOT DETECTED
Benzodiazepine, Ur Scrn: NOT DETECTED
Cannabinoid 50 Ng, Ur ~~LOC~~: NOT DETECTED
Cocaine Metabolite,Ur ~~LOC~~: NOT DETECTED
MDMA (Ecstasy)Ur Screen: NOT DETECTED
Methadone Scn, Ur: NOT DETECTED
OPIATE, UR SCREEN: NOT DETECTED
Phencyclidine (PCP) Ur S: NOT DETECTED
Tricyclic, Ur Screen: NOT DETECTED

## 2018-01-14 SURGERY — CYSTOSCOPY/URETEROSCOPY/HOLMIUM LASER/STENT PLACEMENT
Anesthesia: General | Laterality: Left

## 2018-01-14 MED ORDER — FENTANYL CITRATE (PF) 100 MCG/2ML IJ SOLN
INTRAMUSCULAR | Status: AC
  Filled 2018-01-14: qty 2

## 2018-01-14 MED ORDER — FENTANYL CITRATE (PF) 100 MCG/2ML IJ SOLN
INTRAMUSCULAR | Status: AC
  Administered 2018-01-14: 25 ug via INTRAVENOUS
  Filled 2018-01-14: qty 2

## 2018-01-14 MED ORDER — ONDANSETRON HCL 4 MG/2ML IJ SOLN: 4.0000 mg | Freq: Once | INTRAMUSCULAR | Status: DC | PRN

## 2018-01-14 MED ORDER — MIDAZOLAM HCL 2 MG/2ML IJ SOLN
INTRAMUSCULAR | Status: DC | PRN
  Administered 2018-01-14: 2 mg via INTRAVENOUS

## 2018-01-14 MED ORDER — EPHEDRINE SULFATE 50 MG/ML IJ SOLN
INTRAMUSCULAR | Status: DC | PRN
  Administered 2018-01-14 (×3): 10 mg via INTRAVENOUS

## 2018-01-14 MED ORDER — OXYBUTYNIN CHLORIDE 5 MG PO TABS: 5.0000 mg | ORAL_TABLET | Freq: Three times a day (TID) | ORAL | 0 refills | Status: DC | PRN

## 2018-01-14 MED ORDER — DEXAMETHASONE SODIUM PHOSPHATE 10 MG/ML IJ SOLN
INTRAMUSCULAR | Status: DC | PRN
  Administered 2018-01-14: 10 mg via INTRAVENOUS

## 2018-01-14 MED ORDER — MIDAZOLAM HCL 2 MG/2ML IJ SOLN
INTRAMUSCULAR | Status: AC
  Filled 2018-01-14: qty 2

## 2018-01-14 MED ORDER — FENTANYL CITRATE (PF) 100 MCG/2ML IJ SOLN
25.0000 ug | INTRAMUSCULAR | Status: DC | PRN
  Administered 2018-01-14 (×4): 25 ug via INTRAVENOUS

## 2018-01-14 MED ORDER — PROPOFOL 10 MG/ML IV BOLUS
INTRAVENOUS | Status: DC | PRN
  Administered 2018-01-14: 150 mg via INTRAVENOUS

## 2018-01-14 MED ORDER — LACTATED RINGERS IV SOLN
INTRAVENOUS | Status: DC
  Administered 2018-01-14: 13:00:00 via INTRAVENOUS

## 2018-01-14 MED ORDER — FENTANYL CITRATE (PF) 100 MCG/2ML IJ SOLN
INTRAMUSCULAR | Status: DC | PRN
  Administered 2018-01-14: 25 ug via INTRAVENOUS

## 2018-01-14 MED ORDER — OXYCODONE-ACETAMINOPHEN 5-325 MG PO TABS: 1.0000 | ORAL_TABLET | ORAL | 0 refills | Status: DC | PRN

## 2018-01-14 MED ORDER — ONDANSETRON HCL 4 MG/2ML IJ SOLN
INTRAMUSCULAR | Status: DC | PRN
  Administered 2018-01-14: 4 mg via INTRAVENOUS

## 2018-01-14 MED ORDER — CEFAZOLIN SODIUM-DEXTROSE 2-4 GM/100ML-% IV SOLN
INTRAVENOUS | Status: AC
  Filled 2018-01-14: qty 100

## 2018-01-14 MED ORDER — FAMOTIDINE 20 MG PO TABS
20.0000 mg | ORAL_TABLET | Freq: Once | ORAL | Status: AC
  Administered 2018-01-14: 20 mg via ORAL

## 2018-01-14 MED ORDER — DEXAMETHASONE SODIUM PHOSPHATE 10 MG/ML IJ SOLN
INTRAMUSCULAR | Status: AC
  Filled 2018-01-14: qty 2

## 2018-01-14 MED ORDER — GLYCOPYRROLATE 0.2 MG/ML IJ SOLN
INTRAMUSCULAR | Status: DC | PRN
  Administered 2018-01-14: 0.2 mg via INTRAVENOUS

## 2018-01-14 MED ORDER — FAMOTIDINE 20 MG PO TABS
ORAL_TABLET | ORAL | Status: AC
  Administered 2018-01-14: 20 mg via ORAL
  Filled 2018-01-14: qty 1

## 2018-01-14 MED ORDER — LIDOCAINE HCL (CARDIAC) PF 100 MG/5ML IV SOSY
PREFILLED_SYRINGE | INTRAVENOUS | Status: DC | PRN
  Administered 2018-01-14: 100 mg via INTRAVENOUS

## 2018-01-14 MED ORDER — TAMSULOSIN HCL 0.4 MG PO CAPS: 0.4000 mg | ORAL_CAPSULE | Freq: Every day | ORAL | 0 refills | Status: DC

## 2018-01-14 MED ORDER — ONDANSETRON HCL 4 MG/2ML IJ SOLN
INTRAMUSCULAR | Status: AC
  Filled 2018-01-14: qty 4

## 2018-01-14 MED ORDER — PHENYLEPHRINE HCL 10 MG/ML IJ SOLN
INTRAMUSCULAR | Status: DC | PRN
  Administered 2018-01-14 (×3): 100 ug via INTRAVENOUS
  Administered 2018-01-14: 50 ug via INTRAVENOUS

## 2018-01-14 SURGICAL SUPPLY — 31 items
BAG DRAIN CYSTO-URO LG1000N (MISCELLANEOUS) ×3 IMPLANT
BASKET ZERO TIP 1.9FR (BASKET) ×3 IMPLANT
BRUSH SCRUB EZ 1% IODOPHOR (MISCELLANEOUS) ×3 IMPLANT
CATH URETL 5X70 OPEN END (CATHETERS) ×3 IMPLANT
CNTNR SPEC 2.5X3XGRAD LEK (MISCELLANEOUS)
CONT SPEC 4OZ STER OR WHT (MISCELLANEOUS)
CONTAINER SPEC 2.5X3XGRAD LEK (MISCELLANEOUS) IMPLANT
DRAPE UTILITY 15X26 TOWEL STRL (DRAPES) ×3 IMPLANT
DRSG TEGADERM 2-3/8X2-3/4 SM (GAUZE/BANDAGES/DRESSINGS) ×3 IMPLANT
FIBER LASER LITHO 273 (Laser) ×3 IMPLANT
GLOVE BIO SURGEON STRL SZ 6.5 (GLOVE) ×2 IMPLANT
GLOVE BIO SURGEONS STRL SZ 6.5 (GLOVE) ×1
GOWN STRL REUS W/ TWL LRG LVL3 (GOWN DISPOSABLE) ×2 IMPLANT
GOWN STRL REUS W/TWL LRG LVL3 (GOWN DISPOSABLE) ×4
GUIDEWIRE GREEN .038 145CM (MISCELLANEOUS) IMPLANT
INFUSOR MANOMETER BAG 3000ML (MISCELLANEOUS) ×3 IMPLANT
INTRODUCER DILATOR DOUBLE (INTRODUCER) IMPLANT
KIT TURNOVER CYSTO (KITS) ×3 IMPLANT
PACK CYSTO AR (MISCELLANEOUS) ×3 IMPLANT
SENSORWIRE 0.038 NOT ANGLED (WIRE) ×3
SET CYSTO W/LG BORE CLAMP LF (SET/KITS/TRAYS/PACK) ×3 IMPLANT
SHEATH URETERAL 12FR 45CM (SHEATH) ×3 IMPLANT
SHEATH URETERAL 12FRX35CM (MISCELLANEOUS) IMPLANT
SLEEVE GASTRECTOMY 36FR VISIGI (MISCELLANEOUS) ×3 IMPLANT
SOL .9 NS 3000ML IRR  AL (IV SOLUTION) ×2
SOL .9 NS 3000ML IRR UROMATIC (IV SOLUTION) ×1 IMPLANT
STENT URET 6FRX24 CONTOUR (STENTS) IMPLANT
STENT URET 6FRX26 CONTOUR (STENTS) ×3 IMPLANT
SURGILUBE 2OZ TUBE FLIPTOP (MISCELLANEOUS) ×3 IMPLANT
WATER STERILE IRR 1000ML POUR (IV SOLUTION) ×3 IMPLANT
WIRE SENSOR 0.038 NOT ANGLED (WIRE) ×1 IMPLANT

## 2018-01-14 NOTE — Anesthesia Procedure Notes (Signed)
Procedure Name: LMA Insertion Date/Time: 01/14/2018 2:22 PM Performed by: Deri FuellingPrivette, Macdonald Rigor, CRNA Pre-anesthesia Checklist: Patient identified, Emergency Drugs available, Suction available, Patient being monitored and Timeout performed Patient Re-evaluated:Patient Re-evaluated prior to induction Oxygen Delivery Method: Circle system utilized Preoxygenation: Pre-oxygenation with 100% oxygen

## 2018-01-14 NOTE — Interval H&P Note (Signed)
History and Physical Interval Note:  01/14/2018 1:46 PM  Derrick [name]  has presented today for surgery, with the diagnosis of left ureteral stone  The various methods of treatment have been discussed with the patient and family. After consideration of risks, benefits and other options for treatment, the patient has consented to  Procedure(s): CYSTOSCOPY/URETEROSCOPY/HOLMIUM LASER/STENT PLACEMENT (Left) as a surgical intervention .  The patient's history has been reviewed, patient examined, no change in status, stable for surgery.  I have reviewed the patient's chart and labs.  Questions were answered to the patient's satisfaction.    RRR CTAB  Vanna ScotlandAshley Lachlan Mckim

## 2018-01-14 NOTE — Transfer of Care (Signed)
Immediate Anesthesia Transfer of Care Note  Patient: Derrick DrownBrad I [name]  Procedure(s) Performed: CYSTOSCOPY/URETEROSCOPY/HOLMIUM LASER/STENT PLACEMENT (Left )  Patient Location: PACU  Anesthesia Type:General  Level of Consciousness: alert   Airway & Oxygen Therapy: Patient Spontanous Breathing  Post-op Assessment: Report given to RN  Post vital signs: Reviewed  Last Vitals:  Vitals Value Taken Time  BP    Temp    Pulse 88 01/14/2018  3:03 PM  Resp 16 01/14/2018  3:03 PM  SpO2 95 % 01/14/2018  3:03 PM  Vitals shown include unvalidated device data.  Last Pain:  Vitals:   01/14/18 1301  TempSrc: Tympanic  PainSc: 0-No pain         Complications: No apparent anesthesia complications

## 2018-01-14 NOTE — Op Note (Signed)
Date of procedure: 01/14/18  Preoperative diagnosis:  1. Left distal ureteral calculus 2. Left nonobstructing calculi  Postoperative diagnosis:  1. Same as above  Procedure: 1. Left ureteroscopy 2. Left laser lithotripsy 3. Left ureteral stent placement 4. Left retrograde pyelogram 5. Left basket extraction of stone fragment  Surgeon: Vanna ScotlandAshley Daylen Lipsky, MD  Anesthesia: General  Complications: None  Intraoperative findings: Left distal ureteral calculus at level of UO, and removed.  All additional upper tract stones also treated.  Complicated procedure.  EBL: Minimal  Specimens: Stone fragment  Drains: 6 x 26 French double-J ureteral stent on left, string left in place  Indication: Verdie DrownBrad I Brockel is a 47 y.o. patient with a 5 mm left UVJ stone with mild left hydroureteronephrosis along with multiple additional nonobstructing stones who presents today for definitive management.  After reviewing the management options for treatment, he elected to proceed with the above surgical procedure(s). We have discussed the potential benefits and risks of the procedure, side effects of the proposed treatment, the likelihood of the patient achieving the goals of the procedure, and any potential problems that might occur during the procedure or recuperation. Informed consent has been obtained.  Description of procedure:  The patient was taken to the operating room and general anesthesia was induced.  The patient was placed in the dorsal lithotomy position, prepped and draped in the usual sterile fashion, and preoperative antibiotics were administered. A preoperative time-out was performed.   Male sounds were used to dilate the urethral meatus.  A 21 French scope was advanced per urethra into the bladder.  The bladder itself was unremarkable.  The stone could be seen just at the mouth of the UO partially visible.  Using the beak of the scope, I push the stone slightly back from the UO was able to get  a wire around up to level the kidney under fluoroscopic guidance.  This was snapped in place as a safety wire.  A 4.5 French semirigid ureteroscope was then advanced to the level of the stone.  A 273 m laser fiber was then brought in and using the settings of 20 J and 10 Hz, the stone was fragmented into numerous smaller pieces.  A 1.9 French tipless nitinol basket was used to remove each of the pieces until the ureter was entirely clear.  The scope was then advanced up to the level of the iliacs but not able to be advanced beyond this.  A Super Stiff wire was then advanced up to level of the kidney without difficulty.  Next, in order to treat the upper tract stones, I advanced a Cook 12/14 access sheath quite easily under fluoroscopic guidance over the Super Stiff wire into the proximal ureter.  The inner cannula and wire were removed and a flexible 8 [place]French dual digital ureteroscope was advanced to the level of the collecting system.  Each calyx was surveyed.  The large stone in the lower pole was basketed out and sent for analysis.  A few additional stones were fragmented into dust like material using dusting settings of the laser fiber of 0.2 J and 40 Hz.  When no significant stone burden remained, final retrograde pyelogram was performed which showed no hydronephrosis, filling defects, and outline the collecting system to create a roadmap.  There is no extravasation and the ureter appeared to be patent and normal.  The scope was then backed in length the ureter inspecting along the way.  There are no ureteral injuries appreciated as well as  no additional stone fragments.  The safety wire was then backloaded over rigid cystoscope.  A 6 x 6 French double-J ureteral stent was advanced over the wire up to level the kidney.  The wires partially drawn till focal is noted within the renal pelvis.  The wires and fully withdrawn and full coils noted within the bladder.  The string was left attached to the distal coil  of the stent which was affixed to the patient's meatus using Mastisol and Tegaderm.  The patient was then clean and dry, repositioned supine position, first anesthesia, taken the PACU in stable condition.  Plan: Patient remove his own stent on Saturday.  He will follow-up in 4-6 weeks with renal ultrasound prior.  Vanna Scotland, M.D.

## 2018-01-14 NOTE — Anesthesia Post-op Follow-up Note (Signed)
Anesthesia QCDR form completed.        

## 2018-01-14 NOTE — Discharge Instructions (Signed)
AMBULATORY SURGERY  DISCHARGE INSTRUCTIONS   1) The drugs that you were given will stay in your system until tomorrow so for the next 24 hours you should not:  A) Drive an automobile B) Make any legal decisions C) Drink any alcoholic beverage   2) You may resume regular meals tomorrow.  Today it is better to start with liquids and gradually work up to solid foods.  You may eat anything you prefer, but it is better to start with liquids, then soup and crackers, and gradually work up to solid foods.   3) Please notify your doctor immediately if you have any unusual bleeding, trouble breathing, redness and pain at the surgery site, drainage, fever, or pain not relieved by medication. 4)   5) Your post-operative visit with Dr.                                     is: Date:                        Time:    Please call to schedule your post-operative visit.  6) Additional Instructions:       You have a ureteral stent in place.  This is a tube that extends from your kidney to your bladder.  This may cause urinary bleeding, burning with urination, and urinary frequency.  Please call our office or present to the ED if you develop fevers >101 or pain which is not able to be controlled with oral pain medications.  You may be given either Flomax and/ or ditropan to help with bladder spasms and stent pain in addition to pain medications.    You may remove your own stent on Saturday morning.  Sometimes soaking in the bathtub can help loosen the tape.  Once the tube is removed, pull gently until the entire stent is removed.  If you have any difficulty, please give our office a call.  If the stent is prematurely dislodged, please let us know during regular business hours.  [facility] [address], [address] [place], [place] [address] [phone]

## 2018-01-14 NOTE — Anesthesia Preprocedure Evaluation (Signed)
Anesthesia Evaluation  Patient identified by MRN, date of birth, ID band Patient awake    Reviewed: Allergy & Precautions, NPO status , Patient's Chart, lab work & pertinent test results, reviewed documented beta blocker date and time   Airway Mallampati: III  TM Distance: >3 FB     Dental  (+) Chipped   Pulmonary sleep apnea ,           Cardiovascular      Neuro/Psych PSYCHIATRIC DISORDERS Anxiety Depression    GI/Hepatic   Endo/Other    Renal/GU Renal disease     Musculoskeletal   Abdominal   Peds  Hematology   Anesthesia Other Findings   Reproductive/Obstetrics                             Anesthesia Physical Anesthesia Plan  ASA: III  Anesthesia Plan: General   Post-op Pain Management:    Induction: Intravenous  PONV Risk Score and Plan:   Airway Management Planned: LMA  Additional Equipment:   Intra-op Plan:   Post-operative Plan:   Informed Consent: I have reviewed the patients History and Physical, chart, labs and discussed the procedure including the risks, benefits and alternatives for the proposed anesthesia with the patient or authorized representative who has indicated his/her understanding and acceptance.     Plan Discussed with: CRNA  Anesthesia Plan Comments:         Anesthesia Quick Evaluation

## 2018-01-15 ENCOUNTER — Encounter: Payer: Self-pay | Admitting: Urology

## 2018-01-19 NOTE — Anesthesia Postprocedure Evaluation (Signed)
Anesthesia Post Note  Patient: Derrick [name]  Procedure(s) Performed: CYSTOSCOPY/URETEROSCOPY/HOLMIUM LASER/STENT PLACEMENT (Left )  Patient location during evaluation: PACU Anesthesia Type: General Level of consciousness: awake and alert Pain management: pain level controlled Vital Signs Assessment: post-procedure vital signs reviewed and stable Respiratory status: spontaneous breathing, nonlabored ventilation, respiratory function stable and patient connected to nasal cannula oxygen Cardiovascular status: blood pressure returned to baseline and stable Postop Assessment: no apparent nausea or vomiting Anesthetic complications: no     Last Vitals:  Vitals:   01/14/18 1620 01/14/18 1639  BP: 117/61 119/73  Pulse: (!) 53 (!) 51  Resp: 16 16  Temp: (!) 36.3 C   SpO2: 100% 100%    Last Pain:  Vitals:   01/14/18 1639  TempSrc:   PainSc: 0-No pain                 Shaely Gadberry S

## 2018-01-26 LAB — STONE ANALYSIS
Ca Oxalate,Dihydrate: 20 %
Ca Oxalate,Monohydr.: 50 %
Ca phos cry stone ql IR: 30 %
Stone Weight KSTONE: 14.6 mg

## 2018-02-19 ENCOUNTER — Telehealth: Payer: Self-pay | Admitting: Urology

## 2018-02-19 NOTE — Telephone Encounter (Signed)
Pt. Called and ask if our office was trying to reach him. He is traveling and had a missed call but no message. He also wanted to let us know he will be having his RUS done 02/20/18.

## 2018-02-20 ENCOUNTER — Ambulatory Visit
Admission: RE | Admit: 2018-02-20 | Discharge: 2018-02-20 | Disposition: A | Discharge status: Home / Self Care | Payer: BLUE CROSS/BLUE SHIELD | Source: Ambulatory Visit | Attending: Urology | Admitting: Urology

## 2018-02-20 DIAGNOSIS — N201 Calculus of ureter: Secondary | ICD-10-CM | POA: Insufficient documentation

## 2018-02-24 ENCOUNTER — Encounter: Payer: Self-pay | Admitting: Urology

## 2018-02-24 ENCOUNTER — Ambulatory Visit (INDEPENDENT_AMBULATORY_CARE_PROVIDER_SITE_OTHER): Payer: BLUE CROSS/BLUE SHIELD | Admitting: Urology

## 2018-02-24 VITALS — BP 146/85 | HR 54 | Ht 68.0 in | Wt 200.0 lb

## 2018-02-24 DIAGNOSIS — N201 Calculus of ureter: Secondary | ICD-10-CM

## 2018-02-24 NOTE — Progress Notes (Signed)
02/24/2018  11:14 AM   Derrick [name] [birth year] [id number]  Referring provider: No referring provider defined for this encounter.  Chief Complaint  Patient presents with  . Ureteral Stone    HPI: Derrick [name] is a 48 y.o. White or Caucasian male that presents today for his 6-week post operative following left ureteroscopy for an obstructing stone.  He has a personal history of microscopic hematuria, prostatitis and a history of kidney stones. This is his first episode requiring surgical intervention.  He underwent a left ureteroscopy/retrograde pyelogram on 12/18/20199 with findings of left distal ureteral calculus at level of UO which was removed along with additional upper tract stones. Uncomplicated procedure. Patient removed his own stent on 01/17/2018.  Stone analysis revealed composition of 50% Ca Oxalate monohydrate, 30% Ca phosphate, and 20% Ca Oxalate dihydrate.  Renal US with 3-mm calculus in lower pole of left collectin system, no hydronephrosis.  Patient denies dysuria, gross hematuria or flank/abdominal/pelvic/scrotal pain.  Patient reports that he has maternal and paternal family history of kidney stones.  PMH: Past Medical History:  Diagnosis Date  . Anxiety   . Depression   . History of kidney stones   . Renal disorder    kidney stones  . Sleep apnea    Surgical History: Past Surgical History:  Procedure Laterality Date  . CYSTOSCOPY/URETEROSCOPY/HOLMIUM LASER/STENT PLACEMENT Left 01/14/2018   Procedure: CYSTOSCOPY/URETEROSCOPY/HOLMIUM LASER/STENT PLACEMENT;  Surgeon: Vanna Scotland, MD;  Location: [facility] ORS;  Service: Urology;  Laterality: Left;  Marland Kitchen VASECTOMY     Home Medications:  Allergies as of 02/24/2018   No Known Allergies     Medication List       Accurate as of February 24, 2018 11:14 AM. Always use your most recent med list.        citalopram 40 MG tablet Commonly known as:  CELEXA Take 40 mg by mouth daily.   oxybutynin 5 MG  tablet Commonly known as:  DITROPAN Take 1 tablet (5 mg total) by mouth every 8 (eight) hours as needed for bladder spasms.   oxyCODONE-acetaminophen 5-325 MG tablet Commonly known as:  PERCOCET Take 1-2 tablets by mouth every 4 (four) hours as needed for moderate pain or severe pain.   tamsulosin 0.4 MG Caps capsule Commonly known as:  FLOMAX Take 1 capsule (0.4 mg total) by mouth daily.   tamsulosin 0.4 MG Caps capsule Commonly known as:  FLOMAX Take 1 capsule (0.4 mg total) by mouth daily.       Allergies: No Known Allergies  Family History: Family History  Problem Relation Age of Onset  . Renal cancer Neg Hx   . Prostate cancer Neg Hx    Social History:  reports that he has never smoked. His smokeless tobacco use includes snuff. He reports current alcohol use. He reports previous drug use. Drug: Marijuana.  ROS: UROLOGY Frequent Urination?: No Hard to postpone urination?: No Burning/pain with urination?: No Get up at night to urinate?: No Leakage of urine?: No Urine stream starts and stops?: No Trouble starting stream?: No Do you have to strain to urinate?: No Blood in urine?: No Urinary tract infection?: No Sexually transmitted disease?: No Injury to kidneys or bladder?: No Painful intercourse?: No Weak stream?: No Erection problems?: No Penile pain?: No  Gastrointestinal Nausea?: No Vomiting?: No Indigestion/heartburn?: No Diarrhea?: No Constipation?: No  Constitutional Fever: No Night sweats?: No Weight loss?: No Fatigue?: No  Skin Skin rash/lesions?: No Itching?: No  Eyes Blurred vision?: No Double vision?: No  Ears/Nose/Throat Sore throat?: No Sinus problems?: Yes  Hematologic/Lymphatic Swollen glands?: No Easy bruising?: No  Cardiovascular Leg swelling?: No Chest pain?: No  Respiratory Cough?: No Shortness of breath?: No  Endocrine Excessive thirst?: No  Musculoskeletal Back pain?: No Joint pain?:  No  Neurological Headaches?: No Dizziness?: No  Psychologic Depression?: No Anxiety?: No  Physical Exam: BP (!) 146/85 (BP Location: Left Arm, Patient Position: Sitting)   Pulse (!) 54   Ht 5\' 8"  (1.727 m)   Wt 200 lb (90.7 kg)   BMI 30.41 kg/m   Constitutional:  Alert and oriented, No acute distress. Respiratory: Normal respiratory effort, no increased work of breathing. Head: Normocephalic and Atraumatic. GU: No CVA tenderness Skin: No rashes, bruises or suspicious lesions. Neurologic: Grossly intact, no focal deficits, moving all 4 extremities. Psychiatric: Normal mood and affect.  Pertinent Imaging: US Renal  Result Date: 02/20/2018 CLINICAL DATA:  Left ureteral calculus  EXAM: RENAL / URINARY TRACT ULTRASOUND COMPLETE  COMPARISON:  01/01/2018  FINDINGS:   Right Kidney:   Renal measurements: 10.6 x 5.7 x 6.5 cm = volume: 210 mL .  Echogenicity within normal limits. No mass or hydronephrosis visualized.   Left Kidney:   Renal measurements: 11.9 x 6.9 x 7.2 cm = volume: 305 mL.  Echogenicity within normal limits. No mass or hydronephrosis visualized. 3 mm calculus in the lower pole collecting system.   Bladder: Appears normal for degree of bladder distention.   IMPRESSION: Left nephrolithiasis. No evidence of hydronephrosis.  Left hydronephrosis has resolved.   Electronically Signed By: Jolaine Click M.D.  On: 02/20/2018 08:20   I have personally reviewed all the images today.  Assessment & Plan:    1. Left Nephrolithiasis  - Analysis revealed stone was 50% Ca Oxalate monohydrate, 30% Ca phosphate, and 20% Ca Oxalate dihydrate.  - We discussed general stone prevention techniques including drinking plenty water with goal of producing 2.5 L urine daily, increased citric acid intake, avoidance of high oxalate containing foods, and decreased salt intake.  Information about dietary recommendations given today.   - Provided patient with Stone prevention literature,  The ABCs of Kidney Stones  - I recommend that he continue dietary calcium intake but avoid other forms of supplementation.  2. Left Hydronephrosis  - Resolved, not visualized on 01/2018 RUS   Return if symptoms worsen or fail to improve.  [facility] [address] [place], [place] [address] [phone]  I, Robbi Garter , am acting as a scribe for Vanna Scotland, MD  I have reviewed the above documentation for accuracy and completeness, and I agree with the above.   Vanna Scotland, MD

## 2018-10-29 ENCOUNTER — Ambulatory Visit: Payer: Self-pay

## 2018-10-29 DIAGNOSIS — Z23 Encounter for immunization: Secondary | ICD-10-CM

## 2018-12-22 ENCOUNTER — Encounter: Payer: Self-pay | Admitting: Occupational Medicine

## 2018-12-22 ENCOUNTER — Other Ambulatory Visit: Payer: Self-pay

## 2018-12-22 ENCOUNTER — Ambulatory Visit: Payer: Self-pay | Admitting: Occupational Medicine

## 2018-12-22 VITALS — BP 120/70 | HR 45 | Temp 98.4°F | Resp 12 | Ht 70.0 in | Wt 203.4 lb

## 2018-12-22 DIAGNOSIS — Z021 Encounter for pre-employment examination: Secondary | ICD-10-CM

## 2018-12-22 DIAGNOSIS — F411 Generalized anxiety disorder: Secondary | ICD-10-CM

## 2018-12-22 LAB — POCT URINALYSIS DIPSTICK
Bilirubin, UA: NEGATIVE
Blood, UA: POSITIVE
Glucose, UA: NEGATIVE
Ketones, UA: NEGATIVE
Leukocytes, UA: NEGATIVE
Nitrite, UA: NEGATIVE
Protein, UA: NEGATIVE
Spec Grav, UA: 1.02 (ref 1.010–1.025)
Urobilinogen, UA: 0.2 E.U./dL
pH, UA: 6 (ref 5.0–8.0)

## 2018-12-22 MED ORDER — CITALOPRAM HYDROBROMIDE 40 MG PO TABS: 40.0000 mg | ORAL_TABLET | Freq: Every day | ORAL | 1 refills | Status: DC

## 2018-12-23 LAB — CMP12+LP+TP+TSH+6AC+PSA+CBC…
ALT: 15 IU/L (ref 0–44)
AST: 16 IU/L (ref 0–40)
Albumin/Globulin Ratio: 1.8 (ref 1.2–2.2)
Albumin: 4.6 g/dL (ref 4.0–5.0)
Alkaline Phosphatase: 87 IU/L (ref 39–117)
BUN/Creatinine Ratio: 11 (ref 9–20)
BUN: 12 mg/dL (ref 6–24)
Basophils Absolute: 0.1 10*3/uL (ref 0.0–0.2)
Basos: 1 %
Bilirubin Total: 1 mg/dL (ref 0.0–1.2)
Calcium: 8.9 mg/dL (ref 8.7–10.2)
Chloride: 104 mmol/L (ref 96–106)
Chol/HDL Ratio: 3.2 ratio (ref 0.0–5.0)
EOS (ABSOLUTE): 0.3 10*3/uL (ref 0.0–0.4)
Eos: 5 %
Estimated CHD Risk: <0.5 times avg. (ref 0.0–1.0)
Free Thyroxine Index: 1.8 (ref 1.2–4.9)
GFR calc Af Amer: 94 mL/min/{1.73_m2} (ref >59)
GFR calc non Af Amer: 81 mL/min/{1.73_m2} (ref >59)
GGT: 21 IU/L (ref 0–65)
Globulin, Total: 2.5 g/dL (ref 1.5–4.5)
Glucose: 90 mg/dL (ref 65–99)
HDL: 51 mg/dL (ref >39)
Hematocrit: 49.1 % (ref 37.5–51.0)
Hemoglobin: 17.1 g/dL (ref 13.0–17.7)
Immature Grans (Abs): 0.1 10*3/uL (ref 0.0–0.1)
Immature Granulocytes: 1 %
Iron: 149 ug/dL (ref 38–169)
LDH: 170 IU/L (ref 121–224)
LDL Chol Calc (NIH): 101 mg/dL — ABNORMAL HIGH (ref 0–99)
Lymphs: 25 %
MCH: 32 pg (ref 26.6–33.0)
MCV: 92 fL (ref 79–97)
Neutrophils Absolute: 3 10*3/uL (ref 1.4–7.0)
Neutrophils: 58 %
Phosphorus: 3.1 mg/dL (ref 2.8–4.1)
Platelets: 235 10*3/uL (ref 150–450)
Potassium: 4.7 mmol/L (ref 3.5–5.2)
Prostate Specific Ag, Serum: 0.7 ng/mL (ref 0.0–4.0)
RBC: 5.35 x10E6/uL (ref 4.14–5.80)
RDW: 11.7 % (ref 11.6–15.4)
Sodium: 139 mmol/L (ref 134–144)
T3 Uptake Ratio: 30 % (ref 24–39)
T4, Total: 5.9 ug/dL (ref 4.5–12.0)
TSH: 0.986 u[IU]/mL (ref 0.450–4.500)
Total Protein: 7.1 g/dL (ref 6.0–8.5)
Triglycerides: 57 mg/dL (ref 0–149)
Uric Acid: 3.9 mg/dL (ref 3.7–8.6)
VLDL Cholesterol Cal: 11 mg/dL (ref 5–40)
WBC: 5.2 10*3/uL (ref 3.4–10.8)

## 2018-12-23 LAB — CMP12+LP+TP+TSH+6AC+PSA+CBC?
Cholesterol, Total: 163 mg/dL (ref 100–199)
Creatinine, Ser: 1.07 mg/dL (ref 0.76–1.27)
Lymphocytes Absolute: 1.3 10*3/uL (ref 0.7–3.1)
MCHC: 34.8 g/dL (ref 31.5–35.7)
Monocytes Absolute: 0.5 10*3/uL (ref 0.1–0.9)
Monocytes: 10 %

## 2019-05-04 ENCOUNTER — Other Ambulatory Visit: Payer: Self-pay

## 2019-05-04 ENCOUNTER — Ambulatory Visit: Payer: Self-pay

## 2019-05-04 DIAGNOSIS — Z0000 Encounter for general adult medical examination without abnormal findings: Secondary | ICD-10-CM

## 2019-05-04 LAB — POCT URINALYSIS DIPSTICK
Bilirubin, UA: NEGATIVE
Blood, UA: POSITIVE
Glucose, UA: NEGATIVE
Ketones, UA: NEGATIVE
Leukocytes, UA: NEGATIVE
Nitrite, UA: NEGATIVE
Protein, UA: NEGATIVE
Spec Grav, UA: 1.02 (ref 1.010–1.025)
Urobilinogen, UA: 0.2 E.U./dL
pH, UA: 6.5 (ref 5.0–8.0)

## 2019-05-04 NOTE — Progress Notes (Signed)
Scheduled to complete physical on 05/10/19.  AMD

## 2019-05-06 LAB — CMP12+LP+TP+TSH+6AC+PSA+CBC…
ALT: 19 IU/L (ref 0–44)
AST: 16 IU/L (ref 0–40)
Albumin/Globulin Ratio: 1.7 (ref 1.2–2.2)
Albumin: 4.5 g/dL (ref 4.0–5.0)
Alkaline Phosphatase: 88 IU/L (ref 39–117)
BUN/Creatinine Ratio: 11 (ref 9–20)
BUN: 12 mg/dL (ref 6–24)
Basophils Absolute: 0.1 10*3/uL (ref 0.0–0.2)
Basos: 1 %
Bilirubin Total: 0.6 mg/dL (ref 0.0–1.2)
Calcium: 9.1 mg/dL (ref 8.7–10.2)
Chloride: 101 mmol/L (ref 96–106)
Chol/HDL Ratio: 3.1 ratio (ref 0.0–5.0)
Cholesterol, Total: 187 mg/dL (ref 100–199)
Creatinine, Ser: 1.06 mg/dL (ref 0.76–1.27)
EOS (ABSOLUTE): 0.3 10*3/uL (ref 0.0–0.4)
Eos: 7 %
Estimated CHD Risk: <0.5 times avg. (ref 0.0–1.0)
Free Thyroxine Index: 1.7 (ref 1.2–4.9)
GFR calc Af Amer: 95 mL/min/{1.73_m2} (ref >59)
GFR calc non Af Amer: 82 mL/min/{1.73_m2} (ref >59)
GGT: 21 IU/L (ref 0–65)
Globulin, Total: 2.6 g/dL (ref 1.5–4.5)
Glucose: 99 mg/dL (ref 65–99)
HDL: 61 mg/dL (ref >39)
Hematocrit: 50.7 % (ref 37.5–51.0)
Hemoglobin: 16.9 g/dL (ref 13.0–17.7)
Immature Grans (Abs): 0.1 10*3/uL (ref 0.0–0.1)
Immature Granulocytes: 2 %
Iron: 136 ug/dL (ref 38–169)
LDH: 151 IU/L (ref 121–224)
LDL Chol Calc (NIH): 118 mg/dL — ABNORMAL HIGH (ref 0–99)
Lymphocytes Absolute: 1.3 10*3/uL (ref 0.7–3.1)
Lymphs: 28 %
MCH: 31.2 pg (ref 26.6–33.0)
MCHC: 33.3 g/dL (ref 31.5–35.7)
MCV: 94 fL (ref 79–97)
Monocytes Absolute: 0.5 10*3/uL (ref 0.1–0.9)
Monocytes: 11 %
Neutrophils Absolute: 2.4 10*3/uL (ref 1.4–7.0)
Neutrophils: 51 %
Phosphorus: 2.9 mg/dL (ref 2.8–4.1)
Platelets: 232 10*3/uL (ref 150–450)
Potassium: 4.3 mmol/L (ref 3.5–5.2)
Prostate Specific Ag, Serum: 0.7 ng/mL (ref 0.0–4.0)
RBC: 5.42 x10E6/uL (ref 4.14–5.80)
RDW: 11.8 % (ref 11.6–15.4)
Sodium: 138 mmol/L (ref 134–144)
T3 Uptake Ratio: 29 % (ref 24–39)
T4, Total: 5.7 ug/dL (ref 4.5–12.0)
TSH: 0.872 u[IU]/mL (ref 0.450–4.500)
Total Protein: 7.1 g/dL (ref 6.0–8.5)
Triglycerides: 43 mg/dL (ref 0–149)
Uric Acid: 4.3 mg/dL (ref 3.8–8.4)
VLDL Cholesterol Cal: 8 mg/dL (ref 5–40)
WBC: 4.7 10*3/uL (ref 3.4–10.8)

## 2019-05-06 LAB — QUANTIFERON-TB GOLD PLUS
QuantiFERON Mitogen Value: >10 IU/mL
QuantiFERON Nil Value: 0.03 IU/mL
QuantiFERON TB1 Ag Value: 0.03 IU/mL
QuantiFERON TB2 Ag Value: 0.02 IU/mL
QuantiFERON-TB Gold Plus: NEGATIVE

## 2019-05-10 ENCOUNTER — Encounter: Payer: Self-pay | Admitting: Emergency Medicine

## 2019-05-10 ENCOUNTER — Ambulatory Visit: Payer: Self-pay | Admitting: Emergency Medicine

## 2019-05-10 ENCOUNTER — Other Ambulatory Visit: Payer: Self-pay

## 2019-05-10 VITALS — BP 131/79 | HR 56 | Temp 98.6°F | Resp 12 | Ht 70.0 in | Wt 201.0 lb

## 2019-05-10 DIAGNOSIS — Z0000 Encounter for general adult medical examination without abnormal findings: Secondary | ICD-10-CM

## 2019-05-10 NOTE — Progress Notes (Signed)
None    (approximate)  I have reviewed the triage vital signs and the nursing notes.   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  He denies any changes from last year.     Past Medical History:  Diagnosis Date  . Anxiety   . Depression   . History of kidney stones   . Renal disorder    kidney stones  . Sleep apnea     There are no problems to display for this patient.   Past Surgical History:  Procedure Laterality Date  . CYSTOSCOPY/URETEROSCOPY/HOLMIUM LASER/STENT PLACEMENT Left 01/14/2018   Procedure: CYSTOSCOPY/URETEROSCOPY/HOLMIUM LASER/STENT PLACEMENT;  Surgeon: Hollice Espy, MD;  Location: [facility] ORS;  Service: Urology;  Laterality: Left;  Marland Kitchen VASECTOMY      Prior to Admission medications   Medication Sig Start Date End Date Taking? Authorizing Provider  citalopram (CELEXA) 40 MG tablet Take 1 tablet (40 mg total) by mouth daily. 12/22/18  Yes Lossie Faes, MD  oxybutynin (DITROPAN) 5 MG tablet Take 1 tablet (5 mg total) by mouth every 8 (eight) hours as needed for bladder spasms. Patient not taking: Reported on 02/24/2018 01/14/18   Hollice Espy, MD  oxyCODONE-acetaminophen (PERCOCET) 5-325 MG tablet Take 1-2 tablets by mouth every 4 (four) hours as needed for moderate pain or severe pain. Patient not taking: Reported on 12/22/2018 01/14/18   Hollice Espy, MD  tamsulosin (FLOMAX) 0.4 MG CAPS capsule Take 1 capsule (0.4 mg total) by mouth daily. Patient not taking: Reported on 12/22/2018 12/17/17   Hollice Espy, MD  tamsulosin (FLOMAX) 0.4 MG CAPS capsule Take 1 capsule (0.4 mg total) by mouth daily. Patient not taking: Reported on 02/24/2018 01/14/18   Hollice Espy, MD    Allergies Patient has no known allergies.  Family History  Problem Relation Age of Onset  . Renal cancer Neg Hx   . Prostate cancer Neg Hx     Social History Social  History   Tobacco Use  . Smoking status: Never Smoker  . Smokeless tobacco: Current User    Types: Snuff  Substance Use Topics  . Alcohol use: Yes    Comment: occasionally  . Drug use: Not Currently    Types: Marijuana    Review of Systems Constitutional: No fever/chills Eyes: No visual changes. ENT: No sore throat.  No change in hearing. Cardiovascular: Denies chest pain. Respiratory: Denies shortness of breath.  Negative for cough. Gastrointestinal: No abdominal pain.  Negative for indigestion.  No nausea, no vomiting.  Genitourinary: Negative for dysuria. Musculoskeletal: Negative for back pain and problems with joints. Skin: Negative for rash. Neurological: Negative for headaches, focal weakness or numbness. ____________________________________________   PHYSICAL EXAM:  Constitutional: Alert and oriented. Well appearing and in no acute distress. Eyes: Conjunctivae are normal. PERRL. EOMI. no nystagmus. Head: Atraumatic. Nose: No congestion/rhinnorhea. Mouth/Throat: Mucous membranes are moist.  Oropharynx non-erythematous. Neck: No stridor.  No cervical tenderness on palpation posteriorly.  No difficulty with range of motion. Cardiovascular: Normal rate, regular rhythm. Grossly normal heart sounds.  Good peripheral circulation. Respiratory: Normal respiratory effort.  No retractions. Lungs CTAB. Gastrointestinal: Soft and nontender. No distention.  Bowel sounds normoactive x4 quadrants. Musculoskeletal: Nontender thoracic or lumbar spine.  Patient is able move upper and lower extremities that any difficulty.  No joint tenderness or edema present.  Good muscle strength bilaterally.  Patient is able to ambulate without any assistance.  Neurologic:  Normal speech and language. No gross focal neurologic deficits are appreciated. No gait instability. Skin:  Skin is warm, dry and intact. No rash noted. Psychiatric: Mood and affect are normal. Speech and behavior are  normal.  ____________________________________________   LABS (all labs ordered are listed, but only abnormal results are displayed)  Labs were reviewed.  Hematuria with patient history of multiple stones. ____________________________________________  EKG  Sinus bradycardia with a ventricular rate of 58.  PR interval 160  ___________________________________________   FINAL CLINICAL IMPRESSION(S) / ED DIAGNOSES  Normal healthy male exam.   ED Discharge Orders         Ordered    EKG 12-Lead     05/10/19 1553           Note:  This document was prepared using Dragon voice recognition software and may include unintentional dictation errors.

## 2019-11-25 IMAGING — CT CT ABD-PEL WO/W CM
3 of 12 series · 12 of 46 positions shown, 18 images · IV contrast (iopamidol)
Comparison: CT abdomen pelvis 12/03/2009.

CLINICAL DATA: Patient with history of renal stones. Low back pain.

EXAM:
CT ABDOMEN AND PELVIS WITHOUT AND WITH CONTRAST
TECHNIQUE: Multidetector CT imaging of the abdomen and pelvis was performed
following the standard protocol before and following the bolus
administration of intravenous contrast.
CONTRAST:  125mL P3B9HU-GMM IOPAMIDOL (P3B9HU-GMM) INJECTION 61%

[Series 2: without pre · axial · non-contrast · 0.81mm/px · z∈[-1579,-1409]mm · 4 of 92 slices shown]
[im 12/92  soft-tissue]
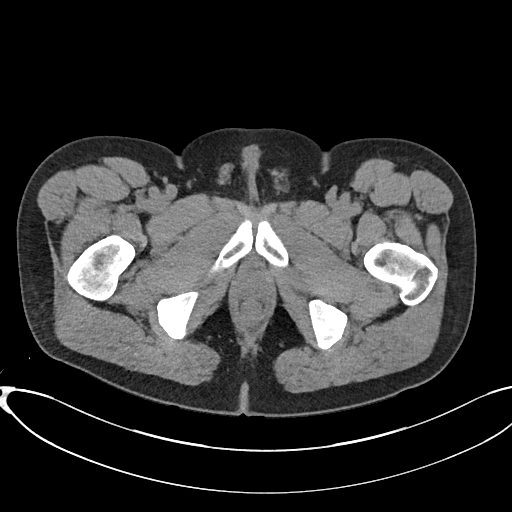
[im 23/92  soft-tissue]
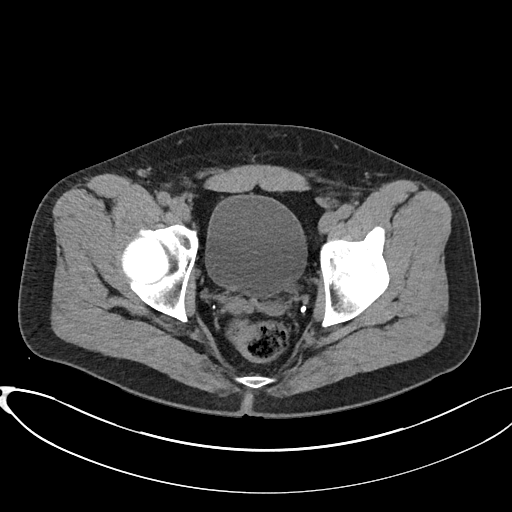
[im 35/92  soft-tissue]
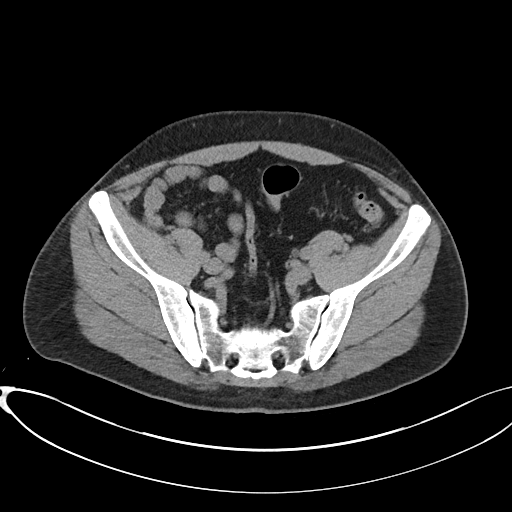
[im 46/92  soft-tissue]
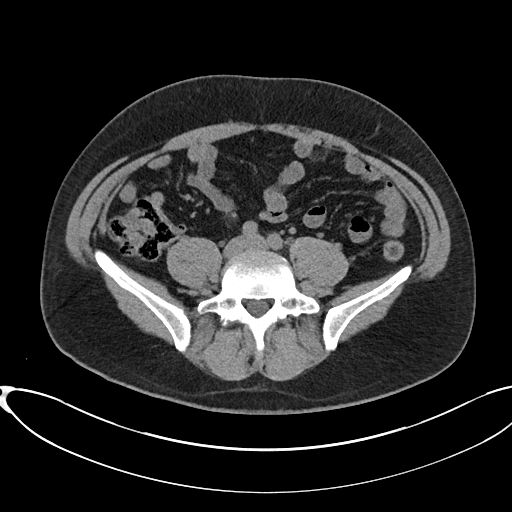

[Series 5: cor without without pre · coronal · non-contrast · 0.76mm/px · 2 of 146 slices shown, 3 images]
[im 49/146  soft-tissue]
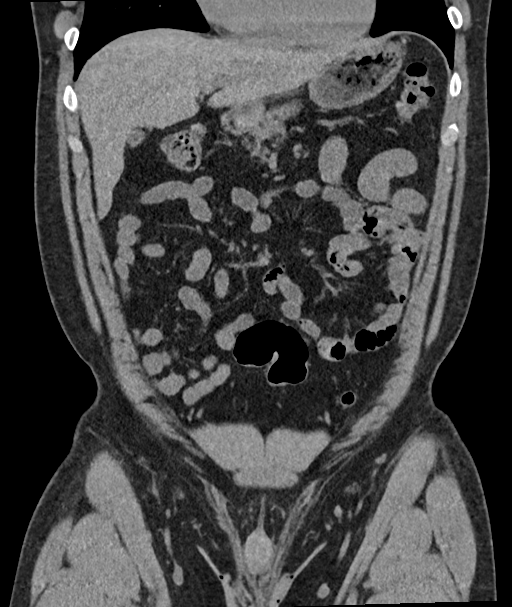
[im 49/146  bone]
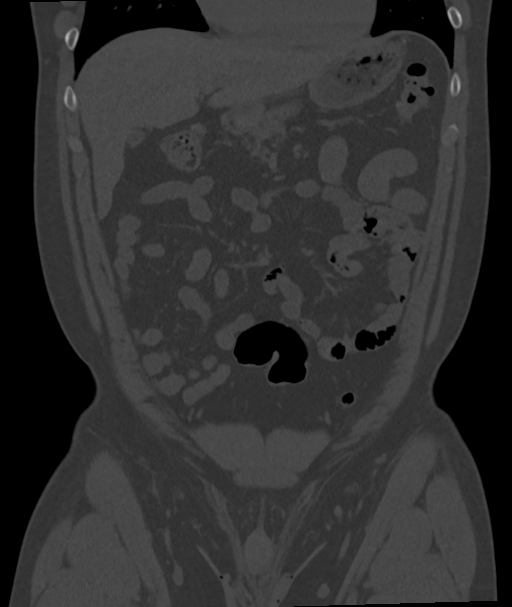
[im 97/146  soft-tissue]
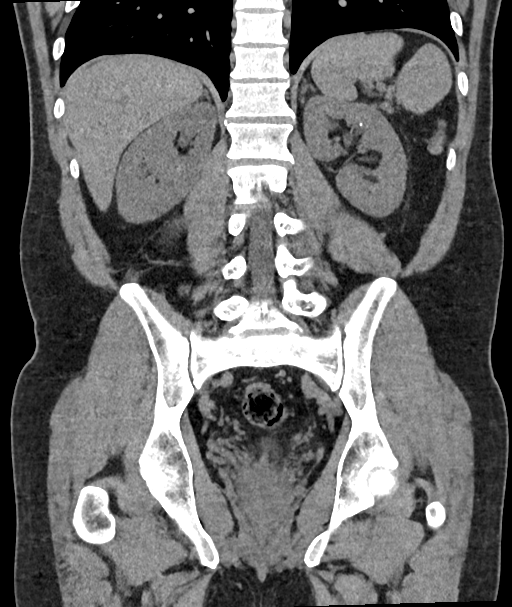

[Series 17: axial delay delay prone · axial · delayed · 0.70mm/px · z∈[-1597,-1267]mm · 6 of 94 slices shown, 11 images]
[im 14/94  soft-tissue]
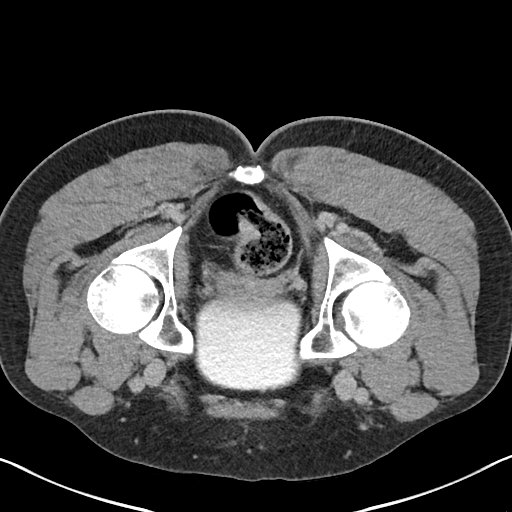
[im 14/94  bone]
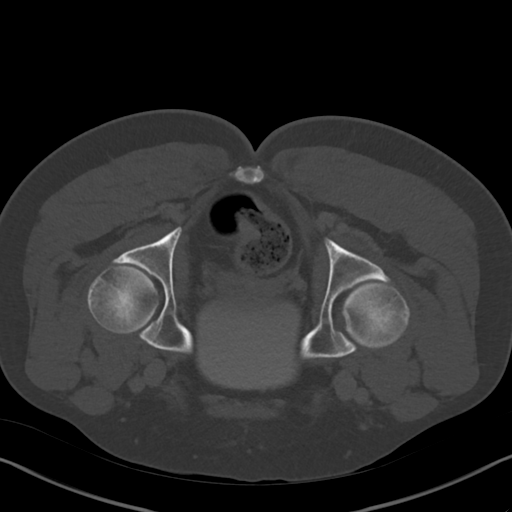
[im 27/94  soft-tissue]
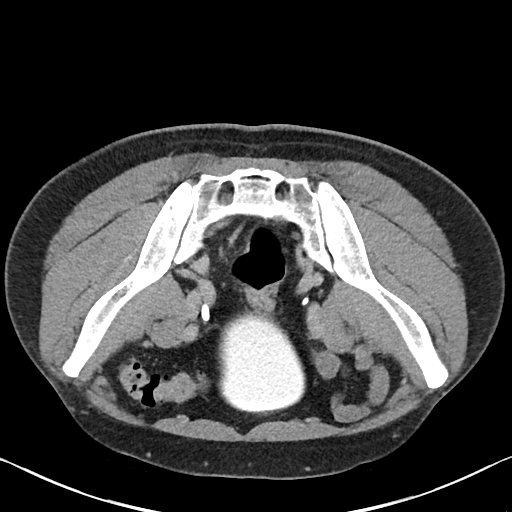
[im 40/94  soft-tissue]
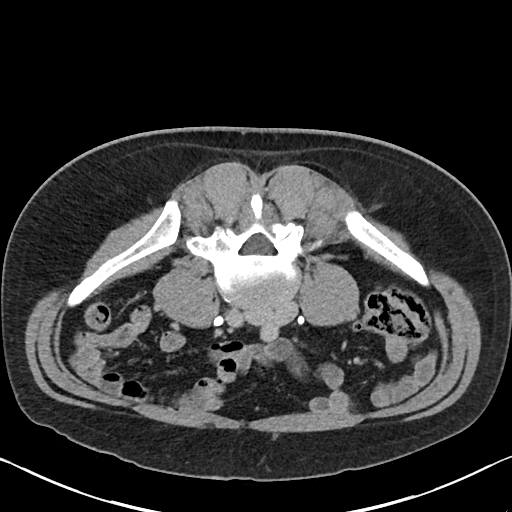
[im 40/94  lung]
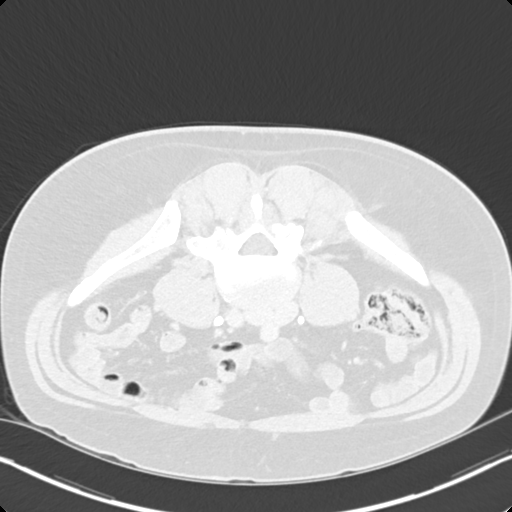
[im 54/94  soft-tissue]
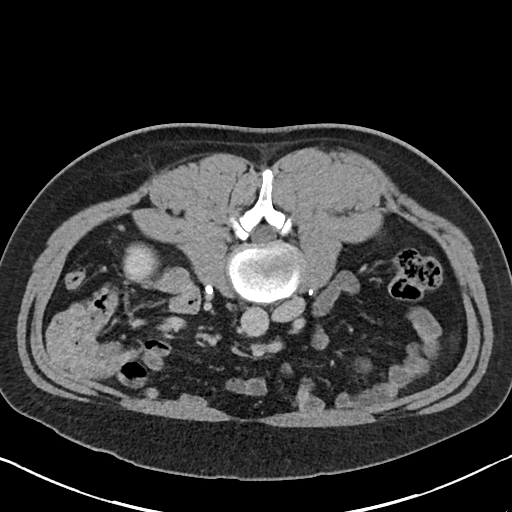
[im 54/94  lung]
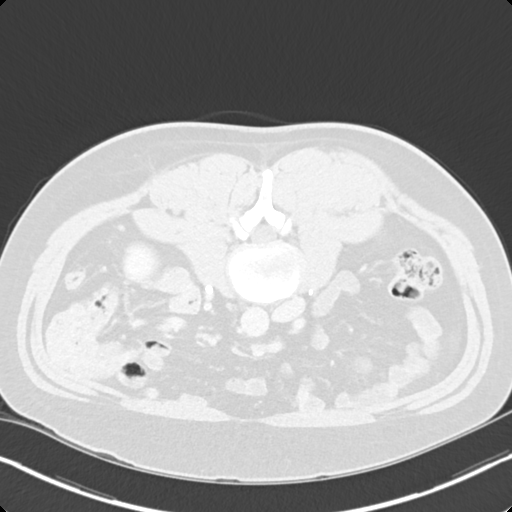
[im 67/94  soft-tissue]
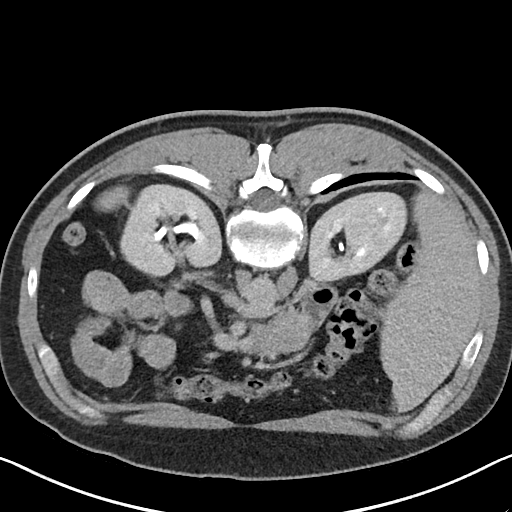
[im 67/94  lung]
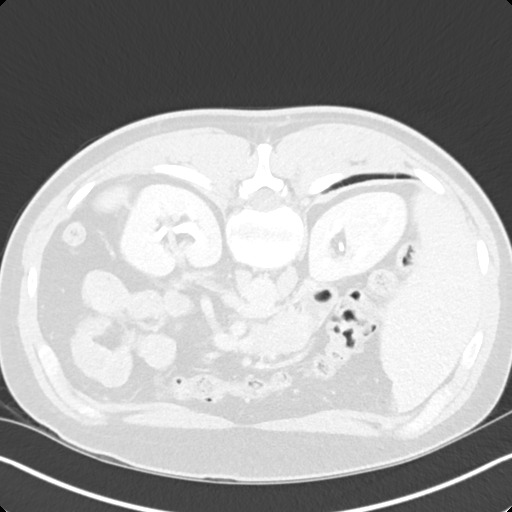
[im 80/94  soft-tissue]
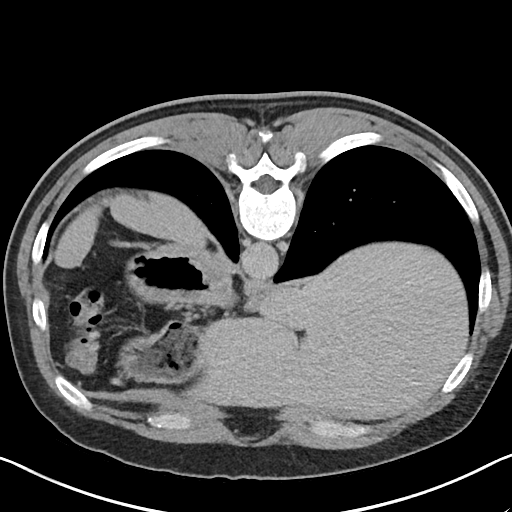
[im 80/94  lung]
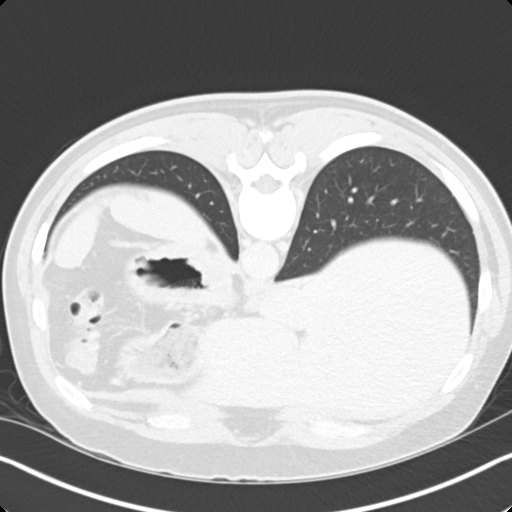

[12 of 46 positions shown; findings below may reference images not displayed]

FINDINGS: Lower chest: Normal heart size. Lung bases are clear. No pleural
effusion.

Hepatobiliary: Liver is normal in size and contour. There's a 1.2 cm
cyst right hepatic lobe (image 20; series 9). Gallbladder is
decompressed. No intrahepatic or extrahepatic biliary ductal
dilatation.

Pancreas: Unremarkable

Spleen: Unremarkable

Adrenals/Urinary Tract: Normal adrenal glands. Kidneys enhance
symmetrically with contrast. No suspicious enhancing renal mass
identified. Noncontrast images demonstrate multiple stones within
the left kidney measuring up to 5 mm within the interpolar region
(image 91; series 5). Mild left hydroureteronephrosis. There is a 5
mm stone just within the left aspect of the urinary bladder at the
left UVJ (image 72; series 2). Urinary bladder is unremarkable.
Delayed images demonstrate excretion of contrast material into the
bilateral renal collecting systems and ureters. No filling defects
are identified. Stone is demonstrated at the distal left ureter at
the UVJ.

Stomach/Bowel: Sigmoid colonic diverticulosis. No CT evidence for
acute diverticulitis. Normal appendix. Normal morphology of the
stomach. No free fluid or free intraperitoneal air.

Vascular/Lymphatic: Normal caliber abdominal aorta. No
retroperitoneal lymphadenopathy.

Reproductive: Prostate unremarkable.

Other: None.

Musculoskeletal: Lumbar spine degenerative changes. No aggressive or
acute appearing osseous lesions.
IMPRESSION: 1. There is a 5 mm stone within the distal left ureter just into the
urinary bladder at the UVJ resulting in mild left
hydroureteronephrosis.
2. Multiple additional left-sided renal stones.
3. No suspicious enhancing renal mass.
4. No abnormal filling defects within the opacified renal collecting
systems and ureters.
5. These results will be called to the ordering clinician or
representative by the Radiologist Assistant, and communication
documented in the PACS or zVision Dashboard.

## 2019-12-02 ENCOUNTER — Ambulatory Visit: Payer: Self-pay

## 2019-12-02 ENCOUNTER — Other Ambulatory Visit: Payer: Self-pay

## 2019-12-02 DIAGNOSIS — Z1152 Encounter for screening for COVID-19: Secondary | ICD-10-CM

## 2019-12-03 LAB — SARS-COV-2, NAA 2 DAY TAT

## 2019-12-03 LAB — NOVEL CORONAVIRUS, NAA: SARS-CoV-2, NAA: NOT DETECTED

## 2019-12-06 ENCOUNTER — Other Ambulatory Visit: Payer: 59

## 2020-05-24 NOTE — Progress Notes (Signed)
lambee[place] scheduled to complete physical 05/31/20 with Laray Anger   CL,RMA

## 2020-05-25 ENCOUNTER — Other Ambulatory Visit: Payer: Self-pay

## 2020-05-25 ENCOUNTER — Ambulatory Visit: Payer: Self-pay

## 2020-05-25 VITALS — BP 140/90 | HR 51

## 2020-05-25 DIAGNOSIS — Z0000 Encounter for general adult medical examination without abnormal findings: Secondary | ICD-10-CM

## 2020-05-25 LAB — POCT URINALYSIS DIPSTICK
Bilirubin, UA: NEGATIVE
Glucose, UA: NEGATIVE
Ketones, UA: NEGATIVE
Leukocytes, UA: NEGATIVE
Nitrite, UA: NEGATIVE
Protein, UA: NEGATIVE
Spec Grav, UA: 1.025 (ref 1.010–1.025)
Urobilinogen, UA: 0.2 E.U./dL
pH, UA: 6 (ref 5.0–8.0)

## 2020-05-26 LAB — CMP12+LP+TP+TSH+6AC+PSA+CBC…
ALT: 16 IU/L (ref 0–44)
AST: 15 IU/L (ref 0–40)
Albumin/Globulin Ratio: 1.6 (ref 1.2–2.2)
Albumin: 4.2 g/dL (ref 3.8–4.9)
Alkaline Phosphatase: 85 IU/L (ref 44–121)
BUN/Creatinine Ratio: 13 (ref 9–20)
BUN: 13 mg/dL (ref 6–24)
Basophils Absolute: 0.1 10*3/uL (ref 0.0–0.2)
Basos: 1 %
Bilirubin Total: 0.8 mg/dL (ref 0.0–1.2)
Calcium: 8.9 mg/dL (ref 8.7–10.2)
Chloride: 101 mmol/L (ref 96–106)
Chol/HDL Ratio: 3.1 ratio (ref 0.0–5.0)
Cholesterol, Total: 173 mg/dL (ref 100–199)
Creatinine, Ser: 1.01 mg/dL (ref 0.76–1.27)
EOS (ABSOLUTE): 0.4 10*3/uL (ref 0.0–0.4)
Eos: 8 %
Estimated CHD Risk: <0.5 times avg. (ref 0.0–1.0)
Free Thyroxine Index: 2 (ref 1.2–4.9)
GGT: 22 IU/L (ref 0–65)
Globulin, Total: 2.6 g/dL (ref 1.5–4.5)
Glucose: 91 mg/dL (ref 65–99)
HDL: 55 mg/dL (ref >39)
Hematocrit: 48.2 % (ref 37.5–51.0)
Hemoglobin: 16.5 g/dL (ref 13.0–17.7)
Immature Grans (Abs): 0.1 10*3/uL (ref 0.0–0.1)
Immature Granulocytes: 1 %
Iron: 119 ug/dL (ref 38–169)
LDH: 168 IU/L (ref 121–224)
LDL Chol Calc (NIH): 108 mg/dL — ABNORMAL HIGH (ref 0–99)
Lymphocytes Absolute: 1.3 10*3/uL (ref 0.7–3.1)
Lymphs: 24 %
MCH: 31.9 pg (ref 26.6–33.0)
MCHC: 34.2 g/dL (ref 31.5–35.7)
MCV: 93 fL (ref 79–97)
Monocytes Absolute: 0.6 10*3/uL (ref 0.1–0.9)
Monocytes: 11 %
Neutrophils Absolute: 2.9 10*3/uL (ref 1.4–7.0)
Neutrophils: 55 %
Phosphorus: 3 mg/dL (ref 2.8–4.1)
Platelets: 236 10*3/uL (ref 150–450)
Potassium: 4.7 mmol/L (ref 3.5–5.2)
Prostate Specific Ag, Serum: 0.7 ng/mL (ref 0.0–4.0)
RBC: 5.18 x10E6/uL (ref 4.14–5.80)
RDW: 11.9 % (ref 11.6–15.4)
Sodium: 138 mmol/L (ref 134–144)
T3 Uptake Ratio: 32 % (ref 24–39)
T4, Total: 6.2 ug/dL (ref 4.5–12.0)
TSH: 0.819 u[IU]/mL (ref 0.450–4.500)
Total Protein: 6.8 g/dL (ref 6.0–8.5)
Triglycerides: 51 mg/dL (ref 0–149)
Uric Acid: 4 mg/dL (ref 3.8–8.4)
VLDL Cholesterol Cal: 10 mg/dL (ref 5–40)
WBC: 5.3 10*3/uL (ref 3.4–10.8)
eGFR: 90 mL/min/{1.73_m2} (ref >59)

## 2020-05-31 ENCOUNTER — Other Ambulatory Visit: Payer: Self-pay

## 2020-05-31 ENCOUNTER — Encounter: Payer: Self-pay | Admitting: Physician Assistant

## 2020-05-31 ENCOUNTER — Ambulatory Visit: Payer: Self-pay | Admitting: Physician Assistant

## 2020-05-31 VITALS — BP 138/84 | HR 64 | Temp 98.1°F | Resp 14 | Ht 70.0 in | Wt 195.0 lb

## 2020-05-31 DIAGNOSIS — Z0000 Encounter for general adult medical examination without abnormal findings: Secondary | ICD-10-CM

## 2020-05-31 NOTE — Progress Notes (Signed)
   Subjective: Annual firefighter exam    Patient ID: Derrick [name], male    DOB: [birth year], 50 y.o.   MRN: [id number]  HPI Patient presents for annual firefighter exam.  Patient states no concerns or complaints. Review of Systems Negative    Objective:   Physical Exam No acute distress.  Temperature 98.1, pulse 64, respiration 14, BP 138/81,patient is 98% O2 sat on room air. HEENT is unremarkable.  Neck is supple for adenopathy or bruits.  Lungs are clear to auscultation.   Heart regular rate and rhythm.  No acute findings on EKG. Abdomen negative HSM, normoactive bowel sounds, soft, nontender to palpation. No obvious deformity to the upper or lower extremities.  Patient has full and equal range of motion upper and lower extremities. No obvious deformity to the cervical or lumbar spine.  Patient has full and equal range of motion of the cervical and lumbar spine. Cranial nerves II through XII are grossly intact.       Assessment & Plan: Well exam.  Discussed lab results with patient.  Advised follow-up as necessary.

## 2020-06-09 ENCOUNTER — Encounter: Payer: Self-pay | Admitting: Physician Assistant

## 2020-06-09 ENCOUNTER — Ambulatory Visit: Payer: Self-pay | Admitting: Physician Assistant

## 2020-06-09 ENCOUNTER — Other Ambulatory Visit: Payer: Self-pay

## 2020-06-09 VITALS — BP 124/89 | HR 92 | Temp 98.6°F | Resp 14 | Ht 69.0 in | Wt 195.0 lb

## 2020-06-09 DIAGNOSIS — B079 Viral wart, unspecified: Secondary | ICD-10-CM

## 2020-06-09 NOTE — Progress Notes (Signed)
   Subjective: Wart    Patient ID: Derrick [name], male    DOB: 1970-06-22, 50 y.o.   MRN: [id number]  HPI Patient presents for papular lesions consistent with wart on the dorsal aspect of right hand.  Patient requests removal.   Review of Systems    Negative septal complaint Objective:   Physical Exam Papular lesion dorsal right hand consistent with wart.       Assessment & Plan: Wart right hand  Cryotherapy applied to dorsal aspect of right hand.  Patient vies follow-up 1 week if no resolution.

## 2020-12-08 ENCOUNTER — Ambulatory Visit: Payer: 59

## 2020-12-08 ENCOUNTER — Other Ambulatory Visit: Payer: Self-pay

## 2020-12-08 DIAGNOSIS — Z23 Encounter for immunization: Secondary | ICD-10-CM

## 2020-12-11 ENCOUNTER — Encounter: Payer: Self-pay | Admitting: Physician Assistant

## 2020-12-11 ENCOUNTER — Other Ambulatory Visit: Payer: Self-pay

## 2020-12-11 ENCOUNTER — Ambulatory Visit: Payer: Self-pay | Admitting: Physician Assistant

## 2020-12-11 VITALS — BP 135/85 | HR 66 | Temp 97.8°F | Resp 14 | Ht 69.0 in | Wt 192.0 lb

## 2020-12-11 DIAGNOSIS — F411 Generalized anxiety disorder: Secondary | ICD-10-CM

## 2020-12-11 NOTE — Progress Notes (Signed)
Pt came in Friday 12/08/20 upset w/symptoms of anxiety. He states he was on medication but winged his self off due to "numb" feeling getting worse. Pt put his self back on it and it made him feel a little better but now he feels as if dosage or a new medication is needed./ last dose of celexa was about week and a half ago.Gretel Acre

## 2020-12-11 NOTE — Progress Notes (Signed)
   Subjective: Anxiety    Patient ID: Derrick [name], male    DOB: [birth year], 50 y.o.   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  Patient states increased anxiety secondary to proposal of his son taking over the construction business for another individual.  Patient is anxious over the financial strain and his son regarding care.  Patient has no financial interest in this patient's transaction.  Patient was diagnosed anxiety in 2020 and given a prescription for Celexa.  Patient took the medicines until 6 months ago when he decided to wean himself off the medication without notifying PCP.  Patient state he restarted the medication about a month ago and state it is not working.  Patient states he has now developed trouble sleeping secondary to worrying about his son's business venture.  Patient state he wakes up in the middle night to go to bathroom and then cannot go back to sleep.  State he is able to cope during the day and denies panic attacks.  Patient states constant worry is affecting his normal routine.    Review of Systems Negative except for complaint    Objective:   Physical Exam  No acute distress.  Appears calm Calm at this time.  Temperature is 97.8, pulse 66, BP is 135/85, respiration 14, and patient 97% O2 sat on room air. HEENT is unremarkable.  Neck supple follow-up      Assessment & Plan: Generalized anxiety   Patient is amenable to starting clonazepam 0.25 mg twice daily x 2 weeks.  Patient understands a starting dose and will be titrated as needed.  Patient will follow-up in 2 weeks for reevaluation and medication adjustment as needed.

## 2020-12-12 ENCOUNTER — Other Ambulatory Visit: Payer: Self-pay | Admitting: Physician Assistant

## 2020-12-12 MED ORDER — CLONAZEPAM 0.25 MG PO TBDP: 0.2500 mg | ORAL_TABLET | Freq: Two times a day (BID) | ORAL | 0 refills | Status: DC

## 2020-12-25 ENCOUNTER — Ambulatory Visit: Payer: Self-pay | Admitting: Physician Assistant

## 2020-12-25 ENCOUNTER — Encounter: Payer: Self-pay | Admitting: Physician Assistant

## 2020-12-25 VITALS — BP 141/86 | HR 65 | Temp 98.0°F | Resp 14 | Ht 69.0 in | Wt 192.0 lb

## 2020-12-25 DIAGNOSIS — F411 Generalized anxiety disorder: Secondary | ICD-10-CM

## 2020-12-25 MED ORDER — ALPRAZOLAM 0.5 MG PO TABS: 0.5000 mg | ORAL_TABLET | Freq: Two times a day (BID) | ORAL | 0 refills | Status: DC | PRN

## 2020-12-25 NOTE — Progress Notes (Signed)
Pt states seems like things are getting better.Derrick [name]

## 2020-12-25 NOTE — Progress Notes (Signed)
   Subjective: Anxiety    Patient ID: Derrick [name], male    DOB: Aug 11, 1970, 50 y.o.   MRN: [id number]  HPI Patient 2-week follow-up for anxiety has been prescribed Xanax 0.25 mg twice daily.  Patient stated noticeable improvement but still have intermittent breakthrough episodes.  Denies any adverse reaction to the medications.   Review of Systems Neck is supple chief complaint    Objective:   Physical Exam No acute distress.  Temperature 98, pulse 65, respiration 14, BP is 141/86 patient weighs 192 pounds and BMI is 28.4.  Patient appears more relaxed from previous visit.      Assessment & Plan: Anxiety   Advised patient will increase doses to 0.5 mg twice daily before 2-week follow-up.

## 2021-01-08 ENCOUNTER — Ambulatory Visit: Payer: 59 | Admitting: Physician Assistant

## 2021-01-08 ENCOUNTER — Other Ambulatory Visit: Payer: Self-pay

## 2021-01-08 ENCOUNTER — Ambulatory Visit: Payer: Self-pay | Admitting: Physician Assistant

## 2021-01-08 ENCOUNTER — Encounter: Payer: Self-pay | Admitting: Physician Assistant

## 2021-01-08 VITALS — BP 128/80 | HR 66 | Temp 97.6°F | Resp 14 | Ht 69.0 in | Wt 192.0 lb

## 2021-01-08 DIAGNOSIS — F411 Generalized anxiety disorder: Secondary | ICD-10-CM

## 2021-01-08 MED ORDER — ALPRAZOLAM 0.5 MG PO TABS: 0.5000 mg | ORAL_TABLET | Freq: Two times a day (BID) | ORAL | 0 refills | Status: DC | PRN

## 2021-01-08 NOTE — Progress Notes (Signed)
   Subjective:    Patient ID: Derrick [name], male    DOB: 1970/09/06, 50 y.o.   MRN: [id number]  HPI Patient follow-up for anxiety status post ORIF Xanax which was titrated up from 0.25 to 0.5 twice daily.  Patient states he is able to tolerate work without becoming overly anxious and is sleeping better.   Review of Systems Negative except for chief complaint    Objective:   Physical Exam  No acute distress.  Temperature 97.6, pulse 66, respiration 14, BP is 120/80, patient 95% O2 sat on room air.  Patient appears less anxious from previous exams. HEENT is unremarkable.  Neck is supple follow-up anatomy or bruits.  Lungs clear to auscultation.  Heart is regular in rhythm.      Assessment & Plan: Anxiety   Advised patient continue previous doses of Xanax and follow-up as needed.

## 2021-01-08 NOTE — Progress Notes (Signed)
Pt presents today for medication/anxiety follow up. Pt states he believes its better. Its tolerable.Derrick [name]

## 2021-01-15 ENCOUNTER — Other Ambulatory Visit: Payer: Self-pay | Admitting: Physician Assistant

## 2021-01-15 MED ORDER — CLONAZEPAM 0.5 MG PO TABS: 0.5000 mg | ORAL_TABLET | Freq: Two times a day (BID) | ORAL | 1 refills | Status: DC | PRN

## 2021-01-16 ENCOUNTER — Other Ambulatory Visit: Payer: Self-pay

## 2021-01-16 DIAGNOSIS — F411 Generalized anxiety disorder: Secondary | ICD-10-CM

## 2021-01-16 MED ORDER — CLONAZEPAM 0.5 MG PO TABS: 0.5000 mg | ORAL_TABLET | Freq: Two times a day (BID) | ORAL | 1 refills | Status: DC | PRN

## 2021-03-05 ENCOUNTER — Other Ambulatory Visit: Payer: Self-pay

## 2021-03-05 ENCOUNTER — Ambulatory Visit: Payer: Self-pay

## 2021-03-05 DIAGNOSIS — F411 Generalized anxiety disorder: Secondary | ICD-10-CM

## 2021-03-05 DIAGNOSIS — Z0000 Encounter for general adult medical examination without abnormal findings: Secondary | ICD-10-CM

## 2021-03-05 LAB — POCT URINALYSIS DIPSTICK
Bilirubin, UA: NEGATIVE
Blood, UA: NEGATIVE
Glucose, UA: NEGATIVE
Ketones, UA: NEGATIVE
Leukocytes, UA: NEGATIVE
Nitrite, UA: NEGATIVE
Protein, UA: NEGATIVE
Spec Grav, UA: >=1.03 — AB (ref 1.010–1.025)
Urobilinogen, UA: 0.2 E.U./dL
pH, UA: 6 (ref 5.0–8.0)

## 2021-03-05 MED ORDER — CLONAZEPAM 0.5 MG PO TABS: 0.5000 mg | ORAL_TABLET | Freq: Two times a day (BID) | ORAL | 5 refills | Status: DC | PRN

## 2021-03-05 NOTE — Progress Notes (Signed)
03/12/21 annual physical scheduled. ?

## 2021-03-06 LAB — CMP12+LP+TP+TSH+6AC+PSA+CBC…
ALT: 11 IU/L (ref 0–44)
AST: 10 IU/L (ref 0–40)
Albumin/Globulin Ratio: 1.7 (ref 1.2–2.2)
Albumin: 4.4 g/dL (ref 3.8–4.9)
Alkaline Phosphatase: 95 IU/L (ref 44–121)
BUN/Creatinine Ratio: 9 (ref 9–20)
BUN: 10 mg/dL (ref 6–24)
Basophils Absolute: 0.1 10*3/uL (ref 0.0–0.2)
Basos: 1 %
Bilirubin Total: 0.7 mg/dL (ref 0.0–1.2)
Calcium: 8.8 mg/dL (ref 8.7–10.2)
Chloride: 104 mmol/L (ref 96–106)
Chol/HDL Ratio: 2.8 ratio (ref 0.0–5.0)
Cholesterol, Total: 163 mg/dL (ref 100–199)
Creatinine, Ser: 1.07 mg/dL (ref 0.76–1.27)
EOS (ABSOLUTE): 0.4 10*3/uL (ref 0.0–0.4)
Eos: 7 %
Estimated CHD Risk: <0.5 times avg. (ref 0.0–1.0)
Free Thyroxine Index: 1.7 (ref 1.2–4.9)
GGT: 18 IU/L (ref 0–65)
Globulin, Total: 2.6 g/dL (ref 1.5–4.5)
Glucose: 93 mg/dL (ref 70–99)
HDL: 58 mg/dL (ref >39)
Hematocrit: 50.4 % (ref 37.5–51.0)
Hemoglobin: 17.3 g/dL (ref 13.0–17.7)
Immature Grans (Abs): 0.1 10*3/uL (ref 0.0–0.1)
Immature Granulocytes: 1 %
Iron: 144 ug/dL (ref 38–169)
LDH: 152 IU/L (ref 121–224)
LDL Chol Calc (NIH): 95 mg/dL (ref 0–99)
Lymphocytes Absolute: 1.6 10*3/uL (ref 0.7–3.1)
Lymphs: 27 %
MCH: 32.2 pg (ref 26.6–33.0)
MCHC: 34.3 g/dL (ref 31.5–35.7)
MCV: 94 fL (ref 79–97)
Monocytes Absolute: 0.6 10*3/uL (ref 0.1–0.9)
Monocytes: 9 %
Neutrophils Absolute: 3.3 10*3/uL (ref 1.4–7.0)
Neutrophils: 55 %
Phosphorus: 3.6 mg/dL (ref 2.8–4.1)
Platelets: 259 10*3/uL (ref 150–450)
Potassium: 4.6 mmol/L (ref 3.5–5.2)
Prostate Specific Ag, Serum: 0.8 ng/mL (ref 0.0–4.0)
RBC: 5.38 x10E6/uL (ref 4.14–5.80)
RDW: 11.8 % (ref 11.6–15.4)
Sodium: 140 mmol/L (ref 134–144)
T3 Uptake Ratio: 28 % (ref 24–39)
T4, Total: 6.2 ug/dL (ref 4.5–12.0)
TSH: 0.908 u[IU]/mL (ref 0.450–4.500)
Total Protein: 7 g/dL (ref 6.0–8.5)
Triglycerides: 50 mg/dL (ref 0–149)
Uric Acid: 4.2 mg/dL (ref 3.8–8.4)
VLDL Cholesterol Cal: 10 mg/dL (ref 5–40)
WBC: 6.1 10*3/uL (ref 3.4–10.8)
eGFR: 84 mL/min/{1.73_m2} (ref >59)

## 2021-03-12 ENCOUNTER — Other Ambulatory Visit: Payer: Self-pay

## 2021-03-12 ENCOUNTER — Encounter: Payer: Self-pay | Admitting: Physician Assistant

## 2021-03-12 ENCOUNTER — Ambulatory Visit: Payer: Self-pay | Admitting: Physician Assistant

## 2021-03-12 VITALS — BP 120/80 | HR 60 | Temp 97.3°F | Resp 12 | Ht 70.0 in | Wt 198.0 lb

## 2021-03-12 DIAGNOSIS — F411 Generalized anxiety disorder: Secondary | ICD-10-CM

## 2021-03-12 DIAGNOSIS — Z0000 Encounter for general adult medical examination without abnormal findings: Secondary | ICD-10-CM

## 2021-03-12 NOTE — Progress Notes (Signed)
[facility]  ____________________________________________   None    (approximate)  I have reviewed the triage vital signs and the nursing notes.   HISTORY  Chief Complaint Annual Exam    HPI HISAO DOO is a 51 y.o. male patient presents for annual physical exam.  Patient voices no concerns or complaints.         Past Medical History:  Diagnosis Date   Anxiety    Depression    History of kidney stones    Renal disorder    kidney stones   Sleep apnea     There are no problems to display for this patient.   Past Surgical History:  Procedure Laterality Date   CYSTOSCOPY/URETEROSCOPY/HOLMIUM LASER/STENT PLACEMENT Left 01/14/2018   Procedure: CYSTOSCOPY/URETEROSCOPY/HOLMIUM LASER/STENT PLACEMENT;  Surgeon: Hollice Espy, MD;  Location: [facility] ORS;  Service: Urology;  Laterality: Left;   VASECTOMY      Prior to Admission medications   Medication Sig Start Date End Date Taking? Authorizing Provider  clonazePAM (KLONOPIN) 0.5 MG tablet Take 1 tablet (0.5 mg total) by mouth 2 (two) times daily as needed for anxiety. 03/05/21  Yes Sable Feil, PA-C    Allergies Patient has no known allergies.  Family History  Problem Relation Age of Onset   Renal cancer Neg Hx    Prostate cancer Neg Hx     Social History Social History   Tobacco Use   Smoking status: Never   Smokeless tobacco: Current    Types: Snuff  Vaping Use   Vaping Use: Never used  Substance Use Topics   Alcohol use: Yes    Comment: occasionally   Drug use: Not Currently    Types: Marijuana    Review of Systems Constitutional: No fever/chills Eyes: No visual changes. ENT: No sore throat. Cardiovascular: Denies chest pain. Respiratory: Denies shortness of breath. Gastrointestinal: No abdominal pain.  No nausea, no vomiting.  No diarrhea.  No constipation. Genitourinary: Negative for dysuria. Musculoskeletal: Negative for back pain. Skin: Negative for  rash. Neurological: Negative for headaches, focal weakness or numbness. Psychiatric: Anxiety  ____________________________________________   PHYSICAL EXAM:  VITAL SIGNS: Temperature is 97.3, pulse 60, respiration 12, BP is 120/80, patient 90% O2 sat on room air.  Patient weighs 198 pounds and BMI is 28.41. Constitutional: Alert and oriented. Well appearing and in no acute distress. Eyes: Conjunctivae are normal. PERRL. EOMI. Head: Atraumatic. Nose: No congestion/rhinnorhea. Mouth/Throat: Mucous membranes are moist.  Oropharynx non-erythematous. Neck: No stridor.  No cervical spine tenderness to palpation. Hematological/Lymphatic/Immunilogical: No cervical lymphadenopathy. Cardiovascular: History of asymptomatic bradycardia with regular rhythm. Grossly normal heart sounds.  Good peripheral circulation. Respiratory: Normal respiratory effort.  No retractions. Lungs CTAB. Gastrointestinal: Soft and nontender. No distention. No abdominal bruits. No CVA tenderness. Genitourinary: Deferred Musculoskeletal: No lower extremity tenderness nor edema.  No joint effusions. Neurologic:  Normal speech and language. No gross focal neurologic deficits are appreciated. No gait instability. Skin:  Skin is warm, dry and intact. No rash noted. Psychiatric: Mood and affect are normal. Speech and behavior are normal.  ____________________________________________   LABS (all labs ordered are listed, but only abnormal results are displayed) 0 Result Notes           Component Ref Range & Units 7 d ago (03/05/21) 9 mo ago (05/25/20) 1 yr ago (05/04/19) 2 yr ago (12/22/18) 3 yr ago (01/07/18) 3 yr ago (12/17/17) 3 yr ago (08/01/17)  Color, UA  Dark Amber  medium yellow  Yellow  Yellow  Yellow R  Yellow R    Clarity, UA  Clear  clear  Clear  Clear      Glucose, UA Negative Negative  Negative  Negative  Negative      Bilirubin, UA  Negative  negative  Negative  Negative  Negative R  Negative R    Ketones, UA   Negative  negative  Negative  Negative  Negative R  Negative R    Spec Grav, UA 1.010 - 1.025 >=1.030 Abnormal   1.025  1.020  1.020  1.020 R  1.020 R    Blood, UA  Negative  trace  Positive CM  Positive  2+ Abnormal  R  Trace Abnormal  R    pH, UA 5.0 - 8.0 6.0  6.0  6.5  6.0  5.5 R  7.0 R    Protein, UA Negative Negative  Negative  Negative  Negative  Negative R  Negative R    Urobilinogen, UA 0.2 or 1.0 E.U./dL 0.2  0.2  0.2  0.2      Nitrite, UA  Negative  negative  Negative  Negative  Negative R  Negative R    Leukocytes, UA Negative Negative  Negative  Negative  Negative  Negative  Negative  NEGATIVE R   Appearance   dark        HAZY Abnormal  R   Odor             Resulting Agency      [facility]         Specimen Collected: 03/05/21 10:22 Last Resulted: 03/05/21 10:22      Lab Flowsheet    Order Details    View Encounter    Lab and Collection Details    Routing    Result History    View Encounter Conversation      CM=Additional comments  R=Reference range differs from displayed range      Result Care Coordination   Patient Communication   Add Comments   Seen Back to Top       Other Results from 03/05/2021  CMP12+LP+TP+TSH+6AC+PSA+CBC Order: [id number] Status: Final result    Visible to patient: Yes (seen)    Next appt: 03/18/2022 at 08:30 AM in [facility] ([facility])    Dx: Routine adult health maintenance    0 Result Notes        Component Ref Range & Units 7 d ago 9 mo ago 1 yr ago 2 yr ago  Glucose 70 - 99 mg/dL 93  91 R  99 R  90 R   Uric Acid 3.8 - 8.4 mg/dL 4.2  4.0 CM  4.3 CM  3.9 R, CM   Comment:            Therapeutic target for gout patients: <6.0  BUN 6 - 24 mg/dL $Remove'10  13  12  12   'CGtUTDV$ Creatinine, Ser 0.76 - 1.27 mg/dL 1.07  1.01  1.06  1.07   eGFR >59 mL/min/1.73 84  90     BUN/Creatinine Ratio 9 - $R'20 9  13  11  11   'Ud$ Sodium 134 - 144 mmol/L 140  138  138  139   Potassium 3.5 - 5.2 mmol/L 4.6  4.7  4.3  4.7   Chloride 96 - 106  mmol/L 104  101  101  104   Calcium 8.7 - 10.2 mg/dL 8.8  8.9  9.1  8.9   Phosphorus  2.8 - 4.1 mg/dL 3.6  3.0  2.9  3.1   Total Protein 6.0 - 8.5 g/dL 7.0  6.8  7.1  7.1   Albumin 3.8 - 4.9 g/dL 4.4  4.2  4.5 R  4.6 R   Globulin, Total 1.5 - 4.5 g/dL 2.6  2.6  2.6  2.5   Albumin/Globulin Ratio 1.2 - 2.2 1.7  1.6  1.7  1.8   Bilirubin Total 0.0 - 1.2 mg/dL 0.7  0.8  0.6  1.0   Alkaline Phosphatase 44 - 121 IU/L 95  85  88 R  87 R   LDH 121 - 224 IU/L 152  168  151  170   AST 0 - 40 IU/L $Remov'10  15  16  16   'Guzbrs$ ALT 0 - 44 IU/L $Remov'11  16  19  15   'IaXQLf$ GGT 0 - 65 IU/L $Remov'18  22  21  21   'wMKSsw$ Iron 38 - 169 ug/dL 144  119  136  149   Cholesterol, Total 100 - 199 mg/dL 163  173  187  163   Triglycerides 0 - 149 mg/dL 50  51  43  57   HDL >39 mg/dL 58  55  61  51   VLDL Cholesterol Cal 5 - 40 mg/dL $Remove'10  10  8  11   'gbjdyXp$ LDL Chol Calc (NIH) 0 - 99 mg/dL 95  108 High   118 High   101 High    Chol/HDL Ratio 0.0 - 5.0 ratio 2.8  3.1 CM  3.1 CM  3.2 CM   Comment:                                   T. Chol/HDL Ratio                                              Men  Women                                1/2 Avg.Risk  3.4    3.3                                    Avg.Risk  5.0    4.4                                 2X Avg.Risk  9.6    7.1                                 3X Avg.Risk 23.4   11.0   Estimated CHD Risk 0.0 - 1.0 times avg.  < 0.5   < 0.5 CM   < 0.5 CM   < 0.5 CM   Comment: The CHD Risk is based on the T. Chol/HDL ratio. Other  factors affect CHD Risk such as hypertension, smoking,  diabetes, severe obesity, and family history of  premature CHD.   TSH 0.450 - 4.500 uIU/mL 0.908  0.819  0.872  0.986   T4, Total 4.5 -  12.0 ug/dL 6.2  6.2  5.7  5.9   T3 Uptake Ratio 24 - 39 % 28  32  29  30   Free Thyroxine Index 1.2 - 4.9 1.7  2.0  1.7  1.8   Prostate Specific Ag, Serum 0.0 - 4.0 ng/mL 0.8  0.7 CM  0.7 CM  0.7 CM   Comment: Roche ECLIA methodology.  According to the American Urological Association, Serum PSA should   decrease and remain at undetectable levels after radical  prostatectomy. The AUA defines biochemical recurrence as an initial  PSA value 0.2 ng/mL or greater followed by a subsequent confirmatory  PSA value 0.2 ng/mL or greater.  Values obtained with different assay methods or kits cannot be used  interchangeably. Results cannot be interpreted as absolute evidence  of the presence or absence of malignant disease.   WBC 3.4 - 10.8 x10E3/uL 6.1  5.3  4.7  5.2   RBC 4.14 - 5.80 x10E6/uL 5.38  5.18  5.42  5.35   Hemoglobin 13.0 - 17.7 g/dL 17.3  16.5  16.9  17.1   Hematocrit 37.5 - 51.0 % 50.4  48.2  50.7  49.1   MCV 79 - 97 fL 94  93  94  92   MCH 26.6 - 33.0 pg 32.2  31.9  31.2  32.0   MCHC 31.5 - 35.7 g/dL 34.3  34.2  33.3  34.8   RDW 11.6 - 15.4 % 11.8  11.9  11.8  11.7   Platelets 150 - 450 x10E3/uL 259  236  232  235   Neutrophils Not Estab. % 55  55  51  58   Lymphs Not Estab. % $Remove'27  24  28  25   'LWvglNX$ Monocytes Not Estab. % $Remove'9  11  11  10   'QwVrGFz$ Eos Not Estab. % $Remove'7  8  7  5   'eSouSHh$ Basos Not Estab. % $Remove'1  1  1  1   'GYwAoSt$ Neutrophils Absolute 1.4 - 7.0 x10E3/uL 3.3  2.9  2.4  3.0   Lymphocytes Absolute 0.7 - 3.1 x10E3/uL 1.6  1.3  1.3  1.3   Monocytes Absolute 0.1 - 0.9 x10E3/uL 0.6  0.6  0.5  0.5   EOS (ABSOLUTE) 0.0 - 0.4 x10E3/uL 0.4  0.4  0.3  0.3   Basophils Absolute 0.0 - 0.2 x10E3/uL 0.1  0.1  0.1  0.1   Immature Granulocytes Not Estab. % $Remove'1  1  2  1   'wtTOPOb$ Immature Grans           _______________________________  EKG   ____________________________________________  RADIOLOGY I, Sable Feil, personally viewed and evaluated these images (plain radiographs) as part of my medical decision making, as well as reviewing the written report by the radiologist.  ED MD interpretation:  Marked sinus  Bradycardia at 45 bpm. -RSR(V1) -nondiagnostic.  Asymptomatic. BORDERLINE RHYTHM   ____________________________________________    ____________________________________________   INITIAL IMPRESSION  / ASSESSMENT AND PLAN / ED COURSE  As part of my medical decision making, I reviewed the following data within the [place]   Discussed lab and EKG findings.  Patient anxiety is well controlled with medication.  Follow-up as needed.         ____________________________________________   FINAL CLINICAL IMPRESSION(S) / ED DIAGNOSES  $RemoveBe'@EDCI'EbvLvAQVp$ @   ED Discharge Orders     None        Note:  This document was prepared using Dragon voice recognition software and may  include unintentional dictation errors. ° °

## 2021-04-02 ENCOUNTER — Other Ambulatory Visit: Payer: Self-pay

## 2021-04-02 DIAGNOSIS — F411 Generalized anxiety disorder: Secondary | ICD-10-CM

## 2021-04-02 MED ORDER — CLONAZEPAM 0.5 MG PO TABS: 0.5000 mg | ORAL_TABLET | Freq: Two times a day (BID) | ORAL | 5 refills | Status: DC | PRN

## 2021-07-09 ENCOUNTER — Telehealth: Payer: Self-pay

## 2021-07-23 ENCOUNTER — Encounter: Payer: Self-pay | Admitting: Physician Assistant

## 2021-07-23 ENCOUNTER — Ambulatory Visit: Payer: Self-pay | Admitting: Physician Assistant

## 2021-07-23 VITALS — BP 123/79 | HR 75 | Temp 98.2°F | Resp 16

## 2021-07-23 DIAGNOSIS — F411 Generalized anxiety disorder: Secondary | ICD-10-CM

## 2021-08-06 ENCOUNTER — Encounter: Payer: Self-pay | Admitting: Physician Assistant

## 2021-08-06 ENCOUNTER — Ambulatory Visit: Payer: Self-pay | Admitting: Physician Assistant

## 2021-08-06 VITALS — BP 153/87 | HR 61 | Resp 14 | Ht 69.0 in | Wt 193.0 lb

## 2021-08-06 DIAGNOSIS — F411 Generalized anxiety disorder: Secondary | ICD-10-CM

## 2021-08-06 MED ORDER — SERTRALINE HCL 100 MG PO TABS: 100.0000 mg | ORAL_TABLET | Freq: Every day | ORAL | 3 refills | Status: DC

## 2021-08-06 NOTE — Progress Notes (Signed)
   Subjective: Generalized anxiety    Patient ID: Derrick [name], male    DOB: 1970/10/06, 51 y.o.   MRN: [id number]  HPI Patient here today for reevaluation of generalized anxiety.  Patient was tapered off his Xanax secondary to drowsy effects.  Patient wished to consider Zoloft.   Review of Systems Negative septa chief complaint    Objective:   Physical Exam  No acute distress. BP is 153/87, pulse 61, respiration 14, and patient 99% O2 sat on room air.  Patient weighs 193 pounds and BMI is 28.50.      Assessment & Plan: Anxiety  Patient was started Zoloft 100 mg daily we will follow-up in 2 weeks.  Sooner if cannot tolerate medication.

## 2021-08-06 NOTE — Progress Notes (Signed)
Pt presents today for follow up on medication. Pt states the plan is to be taken off klonopin and change to zoloft.

## 2021-08-13 ENCOUNTER — Ambulatory Visit: Payer: Self-pay | Admitting: Physician Assistant

## 2021-08-13 ENCOUNTER — Encounter: Payer: Self-pay | Admitting: Physician Assistant

## 2021-08-13 DIAGNOSIS — F411 Generalized anxiety disorder: Secondary | ICD-10-CM

## 2021-08-13 NOTE — Progress Notes (Signed)
   Subjective: Anxiety    Patient ID: Derrick [name], male    DOB: 1970/06/21, 51 y.o.   MRN: [id number]  HPI Patient follow-up telephonically for anxiety.  Patient was changed from Klonopin to Zoloft last week.  Patient states cannot tolerate Zoloft.  Patient states that he seems to have every side effect mention in the drug description.  Patient stated after 3 days of intolerance to the medication he went back to taking Klonopin at 2.5 mg twice daily with moderate relief of his anxiety and no side effects.  Patient requesting to restart Klonopin for anxiety. Review of Systems     Objective:   Physical Exam  This was a virtual visit.      Assessment & Plan:   Patient is amenable to discontinue Zoloft and restart Klonopin at 0.25 mg twice daily.  Patient will follow-up in 2 weeks.

## 2021-08-27 ENCOUNTER — Ambulatory Visit: Payer: Self-pay | Admitting: Physician Assistant

## 2021-08-27 ENCOUNTER — Encounter: Payer: Self-pay | Admitting: Physician Assistant

## 2021-08-27 VITALS — BP 127/85 | HR 55 | Temp 97.5°F | Resp 16 | Wt 200.0 lb

## 2021-08-27 DIAGNOSIS — F411 Generalized anxiety disorder: Secondary | ICD-10-CM

## 2021-08-27 NOTE — Progress Notes (Signed)
   Subjective: Anxiety    Patient ID: RUDRANSH BELLANCA, male    DOB: 1970/05/25, 51 y.o.   MRN: [id number]  HPI Patient is day for follow-up medication change for anxiety.  In the past patient was taking Celexa but cannot tolerate it.  Patient placed on Xanax 0.5 mg twice daily.  Patient states tolerating medicine well anxiety is well controlled.   Review of Systems    Objective:   Physical Exam BP is 127/85, pulse 55, respirations 16, temperature 97.5 patient 90% O2 sat on room air.  Patient weighs 200 pounds BMI is 29 point       Assessment & Plan: Anxiety  Well controlled with Xanax 0.5 mg twice daily.  Follow-up as necessary.

## 2021-08-27 NOTE — Progress Notes (Signed)
Here for f/u with provider about Klonopin.  Stated he feels it's effective.

## 2021-09-24 ENCOUNTER — Other Ambulatory Visit: Payer: Self-pay

## 2021-09-24 DIAGNOSIS — F411 Generalized anxiety disorder: Secondary | ICD-10-CM

## 2021-09-24 MED ORDER — CLONAZEPAM 0.25 MG PO TBDP: 0.2500 mg | ORAL_TABLET | Freq: Two times a day (BID) | ORAL | 5 refills | Status: DC

## 2021-10-08 ENCOUNTER — Ambulatory Visit: Payer: Self-pay | Admitting: Physician Assistant

## 2021-10-08 ENCOUNTER — Encounter: Payer: Self-pay | Admitting: Physician Assistant

## 2021-10-08 VITALS — BP 118/86 | HR 53 | Temp 97.3°F | Wt 202.0 lb

## 2021-10-08 DIAGNOSIS — R1030 Lower abdominal pain, unspecified: Secondary | ICD-10-CM

## 2021-10-08 DIAGNOSIS — R361 Hematospermia: Secondary | ICD-10-CM

## 2021-10-08 DIAGNOSIS — R319 Hematuria, unspecified: Secondary | ICD-10-CM

## 2021-10-08 LAB — POCT URINALYSIS DIPSTICK
Bilirubin, UA: NEGATIVE
Blood, UA: 1
Glucose, UA: NEGATIVE
Ketones, UA: NEGATIVE
Leukocytes, UA: NEGATIVE
Nitrite, UA: NEGATIVE
Protein, UA: NEGATIVE
Spec Grav, UA: >=1.03 — AB (ref 1.010–1.025)
Urobilinogen, UA: 1 E.U./dL
pH, UA: 6 (ref 5.0–8.0)

## 2021-10-08 NOTE — Progress Notes (Signed)
   Subjective:Blood in Semen, hematuria, and testicle pain    Patient ID: Derrick [name], male    DOB: 06-04-1970, 51 y.o.   MRN: [id number]  HPI Patient stated approximately 10 days ago he was helping his son with laying a pavement.  Patient was shoveling stones and felt a pull in t in his scrotum.  Patient to continue to work and went home and took some Aleve with mild relief.  Patient stated continue doing mild duty secondary to discomfort but noticed blood in his semen after sexual intercourse 5 days ago.  Denies gross hematuria.  Denies urethral discharge.  Patient in monogamous relationship.  Stated he try calling urologist today and has not received a call back.  Request consult to urologist. Review of Systems Negative except for chief complaint    Objective:   Physical Exam  BP is 118/86, pulse 53, temperature 97.3, and patient 96% O2 sat on room air.  Patient weighs 22 pounds BMI is 29.83. Urinalysis showed 1+ blood otherwise unremarkable. No scrotum edema or erythema.  Moderate palpation guarding of the left testicle.      Assessment & Plan:Hematospermia and hematuria  Given patient reassurance that the small amount of blood in his semen could be an inflammatory process related to his testicle/groin strain 2 weeks ago.  Patient states he feels better if he have a specialist evaluate his complaint.

## 2021-10-08 NOTE — Progress Notes (Signed)
No difficulty with urination Hasn't seen blood in urine, but semen had some brown to it.  AMD

## 2021-10-08 NOTE — Addendum Note (Signed)
Addended by: Gardner Candle on: 10/08/2021 04:19 PM   Modules accepted: Orders

## 2021-10-17 ENCOUNTER — Ambulatory Visit (INDEPENDENT_AMBULATORY_CARE_PROVIDER_SITE_OTHER): Payer: 59 | Admitting: Urology

## 2021-10-17 VITALS — BP 132/75 | HR 54 | Ht 69.0 in | Wt 194.4 lb

## 2021-10-17 DIAGNOSIS — R3129 Other microscopic hematuria: Secondary | ICD-10-CM | POA: Diagnosis not present

## 2021-10-17 DIAGNOSIS — N418 Other inflammatory diseases of prostate: Secondary | ICD-10-CM

## 2021-10-17 DIAGNOSIS — I861 Scrotal varices: Secondary | ICD-10-CM | POA: Diagnosis not present

## 2021-10-17 DIAGNOSIS — R319 Hematuria, unspecified: Secondary | ICD-10-CM

## 2021-10-17 DIAGNOSIS — R361 Hematospermia: Secondary | ICD-10-CM

## 2021-10-17 LAB — MICROSCOPIC EXAMINATION

## 2021-10-17 LAB — URINALYSIS, COMPLETE
Bilirubin, UA: NEGATIVE
Glucose, UA: NEGATIVE
Ketones, UA: NEGATIVE
Leukocytes,UA: NEGATIVE
Nitrite, UA: NEGATIVE
Protein,UA: NEGATIVE
Specific Gravity, UA: 1.02 (ref 1.005–1.030)
Urobilinogen, Ur: 0.2 mg/dL (ref 0.2–1.0)
pH, UA: 6 (ref 5.0–7.5)

## 2021-10-17 MED ORDER — TAMSULOSIN HCL 0.4 MG PO CAPS: 0.4000 mg | ORAL_CAPSULE | Freq: Every day | ORAL | 0 refills | Status: DC

## 2021-10-17 NOTE — Progress Notes (Signed)
10/17/2021 12:56 PM   Derrick [name] 1970/08/06 [id number]  Referring provider: Sable Feil, PA-C [address] [place],  [place] [address]  Chief Complaint  Patient presents with   New Patient (Initial Visit)    HPI: 51 year old male referred for further evaluation of perineal pain.  He is accompanied today by his wife.  He is known to me, previously seen in 2020 for obstructing ureteral calculus.  He was seen by his PCP on 10/09/2018 at which time he had an injury laying pavement with his son 10 days prior.  He was shoveling and felt a pull in his scrotum.  He took Aleve with some mild relief.  He had some blood in his semen 5 days after.  Specifically, he notes that the pain is situated at the base of his scrotum, more in his perineum.  It is dull and aching for the most part.  Sometimes it seems more aggravated than other times.  Overall it is improving.  He has no associated urinary symptoms.  No dysuria or gross hematuria.    PSA 0.8 in 02/2021   PMH: Past Medical History:  Diagnosis Date   Anxiety    Depression    History of kidney stones    Renal disorder    kidney stones   Sleep apnea     Surgical History: Past Surgical History:  Procedure Laterality Date   CYSTOSCOPY/URETEROSCOPY/HOLMIUM LASER/STENT PLACEMENT Left 01/14/2018   Procedure: CYSTOSCOPY/URETEROSCOPY/HOLMIUM LASER/STENT PLACEMENT;  Surgeon: Hollice Espy, MD;  Location: [facility] ORS;  Service: Urology;  Laterality: Left;   VASECTOMY      Home Medications:  Allergies as of 10/17/2021   No Known Allergies      Medication List        Accurate as of October 17, 2021 11:59 PM. If you have any questions, ask your nurse or doctor.          clonazePAM 0.25 MG disintegrating tablet Commonly known as: KLONOPIN Take 1 tablet (0.25 mg total) by mouth 2 (two) times daily.   tamsulosin 0.4 MG Caps capsule Commonly known as: FLOMAX Take 1 capsule (0.4 mg total) by mouth daily.         Allergies: No Known Allergies  Family History: Family History  Problem Relation Age of Onset   Renal cancer Neg Hx    Prostate cancer Neg Hx     Social History:  reports that he has never smoked. His smokeless tobacco use includes snuff. He reports current alcohol use. He reports that he does not currently use drugs after having used the following drugs: Marijuana.   Physical Exam: BP 132/75   Pulse (!) 54   Ht 5\' 9"  (1.753 m)   Wt 194 lb 6.4 oz (88.2 kg)   BMI 28.71 kg/m   Constitutional:  Alert and oriented, No acute distress. HEENT: [place] AT, moist mucus membranes.  Trachea midline, no masses. Cardiovascular: No clubbing, cyanosis, or edema. Respiratory: Normal respiratory effort, no increased work of breathing. GI: Abdomen is soft, nontender, nondistended, no abdominal masses GU: Bilateral descended testicles, nontender without masses.  Circumcised phallus.  Incidental left varicocele appreciated.. Rectal exam: Normal sphincter tone, normal prostate without nodularity Skin: No rashes, bruises or suspicious lesions. Neurologic: Grossly intact, no focal deficits, moving all 4 extremities. Psychiatric: Normal mood and affect.  Laboratory Data: Lab Results  Component Value Date   WBC 6.1 03/05/2021   HGB 17.3 03/05/2021   HCT 50.4 03/05/2021   MCV 94 03/05/2021  PLT 259 03/05/2021    Lab Results  Component Value Date   CREATININE 1.07 03/05/2021    Urinalysis Results for orders placed or performed in visit on 10/17/21  Microscopic Examination   Urine  Result Value Ref Range   WBC, UA 0-5 0 - 5 /hpf   RBC, Urine 3-10 (A) 0 - 2 /hpf   Epithelial Cells (non renal) 0-10 0 - 10 /hpf   Mucus, UA Present (A) Not Estab.   Bacteria, UA Few None seen/Few  Urinalysis, Complete  Result Value Ref Range   Specific Gravity, UA 1.020 1.005 - 1.030   pH, UA 6.0 5.0 - 7.5   Color, UA Yellow Yellow   Appearance Ur Clear Clear   Leukocytes,UA Negative Negative    Protein,UA Negative Negative/Trace   Glucose, UA Negative Negative   Ketones, UA Negative Negative   RBC, UA Trace (A) Negative   Bilirubin, UA Negative Negative   Urobilinogen, Ur 0.2 0.2 - 1.0 mg/dL   Nitrite, UA Negative Negative   Microscopic Examination See below:      Assessment & Plan:    1. Microscopic hematuria Incidental microscopic hematuria  In the setting of his prostatitis type symptoms we discussed whether or not to go ahead and pursue hematuria evaluation.  Agreed to return to reassess symptoms, repeat urinalysis and if it still present, will more strongly recommend hematuria eval - Urinalysis, Complete  2. Other prostatic inflammatory diseases Based on his symptoms, suspect inflammatory or irritative prostatitis  I recommended supportive care, NSAIDs, and Flomax for the short-term.  There does not appear to be any evidence of a bacterial infection.  3. Varicocele Incidental exam finding, asymptomatic  4. Hematospermia  I explained to the patient some of the conditions that may cause hematospermia, such as: disorders of the prostate gland, seminal vesicles, spermatic cord, and ejaculatory duct system; urogenital infections including sexually transmitted infections (eg, chlamydia, herpes simplex virus, gonorrhea, trichomonas); metastatic cancers; vascular malformations; congenital and drug-induced bleeding disorders; and even frequent daily ejaculation over a period of several weeks.  I reassured him that his exam was normal and that we may never discover a reason for his hematospermia and it is most likely a benign symptom.     We did go ahead and complete prostate cancer screening today as above  Suspect likely related to #2    Return in about 6 weeks (around 11/28/2021) for UA .  Hollice Espy, MD  [facility] [address], [place], [place] [address] [phone]

## 2021-11-27 ENCOUNTER — Encounter: Payer: Self-pay | Admitting: Urology

## 2021-11-27 ENCOUNTER — Ambulatory Visit: Payer: Self-pay

## 2021-11-27 ENCOUNTER — Ambulatory Visit (INDEPENDENT_AMBULATORY_CARE_PROVIDER_SITE_OTHER): Payer: 59 | Admitting: Urology

## 2021-11-27 ENCOUNTER — Other Ambulatory Visit: Payer: Self-pay | Admitting: Urology

## 2021-11-27 VITALS — BP 162/79 | HR 53 | Ht 69.0 in | Wt 201.6 lb

## 2021-11-27 DIAGNOSIS — R3129 Other microscopic hematuria: Secondary | ICD-10-CM

## 2021-11-27 DIAGNOSIS — N418 Other inflammatory diseases of prostate: Secondary | ICD-10-CM | POA: Diagnosis not present

## 2021-11-27 DIAGNOSIS — Z23 Encounter for immunization: Secondary | ICD-10-CM

## 2021-11-27 LAB — MICROSCOPIC EXAMINATION: Bacteria, UA: NONE SEEN

## 2021-11-27 LAB — URINALYSIS, COMPLETE
Bilirubin, UA: NEGATIVE
Glucose, UA: NEGATIVE
Ketones, UA: NEGATIVE
Leukocytes,UA: NEGATIVE
Nitrite, UA: NEGATIVE
Protein,UA: NEGATIVE
Specific Gravity, UA: 1.01 (ref 1.005–1.030)
Urobilinogen, Ur: 0.2 mg/dL (ref 0.2–1.0)
pH, UA: 6 (ref 5.0–7.5)

## 2021-11-27 MED ORDER — MELOXICAM 7.5 MG PO TABS: 7.5000 mg | ORAL_TABLET | Freq: Every day | ORAL | 0 refills | Status: DC

## 2021-11-27 NOTE — Progress Notes (Signed)
11/27/2021 11:50 AM   Derrick [name] 1970-05-04 [id number]  Referring provider: No referring provider defined for this encounter.  Chief Complaint  Patient presents with   Follow-up    HPI: 51 year old male with personal Struve kidney stones, microscopic hematuria in the setting of inflammatory prostatitis, and hematospermia who presents today for follow-up.  He was last seen 6 weeks ago with irritative urinary symptoms and perineal pain.  He was treated with ibuprofen and Flomax.  He returns today for reassessment.  He was also noted to have microscopic hematuria, 3-10 red blood cells per high-powered field.  Urinalysis today is negative.  He reports that within the first week of starting Flomax and more regular ibuprofen, his symptoms almost completely resolved.  He also cut back on doing straining and heavy lifting.  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  This seemed to exacerbate his symptoms again, but they are not as severe as they were previously but he does have some mild perineal pain urgency and frequency again.  He is run out of Flomax.  PSA screening was completed last time, no evidence of prostate cancer.  He is not currently engaging in penetrative sex with his wife in light of his urinary symptoms but has been masturbating.  With the onset of his symptoms again this week, his ejaculate fluid with masturbation has become light pink.   PMH: Past Medical History:  Diagnosis Date   Anxiety    Depression    History of kidney stones    Renal disorder    kidney stones   Sleep apnea     Surgical History: Past Surgical History:  Procedure Laterality Date   CYSTOSCOPY/URETEROSCOPY/HOLMIUM LASER/STENT PLACEMENT Left 01/14/2018   Procedure: CYSTOSCOPY/URETEROSCOPY/HOLMIUM LASER/STENT PLACEMENT;  Surgeon: Hollice Espy, MD;  Location: [facility] ORS;  Service: Urology;  Laterality: Left;    VASECTOMY      Home Medications:  Allergies as of 11/27/2021   No Known Allergies      Medication List        Accurate as of November 27, 2021 11:50 AM. If you have any questions, ask your nurse or doctor.          clonazePAM 0.25 MG disintegrating tablet Commonly known as: KLONOPIN Take 1 tablet (0.25 mg total) by mouth 2 (two) times daily.   meloxicam 7.5 MG tablet Commonly known as: Mobic Take 1 tablet (7.5 mg total) by mouth daily. Started by: Hollice Espy, MD   tamsulosin 0.4 MG Caps capsule Commonly known as: FLOMAX Take 1 capsule (0.4 mg total) by mouth daily.        Allergies: No Known Allergies  Family History: Family History  Problem Relation Age of Onset   Renal cancer Neg Hx    Prostate cancer Neg Hx     Social History:  reports that he has never smoked. His smokeless tobacco use includes snuff. He reports current alcohol use. He reports that he does not currently use drugs after having used the following drugs: Marijuana.   Physical Exam: BP (!) 162/79   Pulse (!) 53   Ht 5\' 9"  (1.753 m)   Wt 201 lb 9.6 oz (91.4 kg)   BMI 29.77 kg/m   Constitutional:  Alert and oriented, No acute distress.  Accompanied by his wife today who is taking notes. HEENT: [place] AT, moist mucus membranes.  Trachea midline, no masses. Cardiovascular: No clubbing, cyanosis, or  edema. Respiratory: Normal respiratory effort, no increased work of breathing. Neurologic: Grossly intact, no focal deficits, moving all 4 extremities. Psychiatric: Normal mood and affect.  Laboratory Data: Lab Results  Component Value Date   WBC 6.1 03/05/2021   HGB 17.3 03/05/2021   HCT 50.4 03/05/2021   MCV 94 03/05/2021   PLT 259 03/05/2021    Lab Results  Component Value Date   CREATININE 1.07 03/05/2021     Urinalysis UA today is negative   Assessment & Plan:    1. Microscopic hematuria Resolved, likely related to inflammatory prostatitis.  We will hold off on further  work-up for the time being, advised to continue to check Return if he has evidence of recurrent microscopic blood - Urinalysis, Complete  2. Other prostatic inflammatory diseases Signs and symptoms consistent with inflammatory prostatitis with waxing and waning symptoms  We discussed continued supportive care, resumption of Flomax which seems to have helped as well as NSAIDs.  Will prescribe 30 days of meloxicam, advised to hold with GI distress or abdominal pain due to concern for GI bleeding.  Thereafter, he can use ibuprofen as needed for flares.  He feels reasonably reassured and is symptoms currently are very mild.  If his symptoms continue to worsen, could consider pelvic floor therapy, he will let us know if he like to pursue this.   Return if symptoms worsen or fail to improve.  Vanna Scotland, MD  [facility] [address] [place], [place] [address] [phone]

## 2021-12-10 DIAGNOSIS — D2261 Melanocytic nevi of right upper limb, including shoulder: Secondary | ICD-10-CM | POA: Diagnosis not present

## 2021-12-10 DIAGNOSIS — D485 Neoplasm of uncertain behavior of skin: Secondary | ICD-10-CM | POA: Diagnosis not present

## 2021-12-10 DIAGNOSIS — D225 Melanocytic nevi of trunk: Secondary | ICD-10-CM | POA: Diagnosis not present

## 2021-12-10 DIAGNOSIS — D2271 Melanocytic nevi of right lower limb, including hip: Secondary | ICD-10-CM | POA: Diagnosis not present

## 2021-12-10 DIAGNOSIS — D0359 Melanoma in situ of other part of trunk: Secondary | ICD-10-CM | POA: Diagnosis not present

## 2021-12-10 DIAGNOSIS — D2272 Melanocytic nevi of left lower limb, including hip: Secondary | ICD-10-CM | POA: Diagnosis not present

## 2021-12-10 DIAGNOSIS — L821 Other seborrheic keratosis: Secondary | ICD-10-CM | POA: Diagnosis not present

## 2021-12-10 DIAGNOSIS — D2262 Melanocytic nevi of left upper limb, including shoulder: Secondary | ICD-10-CM | POA: Diagnosis not present

## 2022-01-04 DIAGNOSIS — D0359 Melanoma in situ of other part of trunk: Secondary | ICD-10-CM | POA: Diagnosis not present

## 2022-01-29 ENCOUNTER — Telehealth: Payer: Self-pay

## 2022-01-29 DIAGNOSIS — K649 Unspecified hemorrhoids: Secondary | ICD-10-CM

## 2022-01-29 DIAGNOSIS — Z1211 Encounter for screening for malignant neoplasm of colon: Secondary | ICD-10-CM

## 2022-01-29 NOTE — Telephone Encounter (Signed)
Loletha Grayer called clinic requesting referral for screening colonoscopy.  States he's 67 & hasn't done one yet.  Also, wants them to see him for external hemorrhoids.  AMD

## 2022-02-11 DIAGNOSIS — K625 Hemorrhage of anus and rectum: Secondary | ICD-10-CM | POA: Diagnosis not present

## 2022-02-11 DIAGNOSIS — Z1211 Encounter for screening for malignant neoplasm of colon: Secondary | ICD-10-CM | POA: Diagnosis not present

## 2022-02-11 DIAGNOSIS — K219 Gastro-esophageal reflux disease without esophagitis: Secondary | ICD-10-CM | POA: Diagnosis not present

## 2022-03-14 DIAGNOSIS — K640 First degree hemorrhoids: Secondary | ICD-10-CM | POA: Diagnosis not present

## 2022-03-14 DIAGNOSIS — Z1211 Encounter for screening for malignant neoplasm of colon: Secondary | ICD-10-CM | POA: Diagnosis not present

## 2022-03-14 DIAGNOSIS — K5730 Diverticulosis of large intestine without perforation or abscess without bleeding: Secondary | ICD-10-CM | POA: Diagnosis not present

## 2022-03-18 ENCOUNTER — Ambulatory Visit: Payer: Self-pay

## 2022-03-18 ENCOUNTER — Encounter: Payer: 59 | Admitting: Physician Assistant

## 2022-03-18 DIAGNOSIS — Z0000 Encounter for general adult medical examination without abnormal findings: Secondary | ICD-10-CM

## 2022-03-18 LAB — POCT URINALYSIS DIPSTICK
Bilirubin, UA: NEGATIVE
Blood, UA: POSITIVE
Glucose, UA: NEGATIVE
Ketones, UA: NEGATIVE
Leukocytes, UA: NEGATIVE
Nitrite, UA: NEGATIVE
Protein, UA: NEGATIVE
Spec Grav, UA: 1.025 (ref 1.010–1.025)
Urobilinogen, UA: 0.2 E.U./dL
pH, UA: 6 (ref 5.0–8.0)

## 2022-03-18 NOTE — Progress Notes (Signed)
Pt presents today to complete lab portion of sceduled physical.

## 2022-03-19 LAB — CMP12+LP+TP+TSH+6AC+PSA+CBC…
ALT: 9 IU/L (ref 0–44)
AST: 13 IU/L (ref 0–40)
Albumin/Globulin Ratio: 1.8 (ref 1.2–2.2)
Albumin: 4.2 g/dL (ref 3.8–4.9)
Alkaline Phosphatase: 81 IU/L (ref 44–121)
BUN/Creatinine Ratio: 9 (ref 9–20)
BUN: 9 mg/dL (ref 6–24)
Basophils Absolute: 0.1 10*3/uL (ref 0.0–0.2)
Basos: 1 %
Bilirubin Total: 0.7 mg/dL (ref 0.0–1.2)
Calcium: 8.6 mg/dL — ABNORMAL LOW (ref 8.7–10.2)
Chloride: 102 mmol/L (ref 96–106)
Chol/HDL Ratio: 2.8 ratio (ref 0.0–5.0)
Cholesterol, Total: 151 mg/dL (ref 100–199)
Creatinine, Ser: 0.97 mg/dL (ref 0.76–1.27)
EOS (ABSOLUTE): 0.4 10*3/uL (ref 0.0–0.4)
Eos: 7 %
Estimated CHD Risk: <0.5 times avg. (ref 0.0–1.0)
Free Thyroxine Index: 1.7 (ref 1.2–4.9)
GGT: 15 IU/L (ref 0–65)
Globulin, Total: 2.4 g/dL (ref 1.5–4.5)
Glucose: 90 mg/dL (ref 70–99)
HDL: 53 mg/dL (ref >39)
Hematocrit: 46.5 % (ref 37.5–51.0)
Hemoglobin: 16.4 g/dL (ref 13.0–17.7)
Immature Grans (Abs): 0 10*3/uL (ref 0.0–0.1)
Immature Granulocytes: 1 %
Iron: 106 ug/dL (ref 38–169)
LDH: 139 IU/L (ref 121–224)
LDL Chol Calc (NIH): 88 mg/dL (ref 0–99)
Lymphocytes Absolute: 1.4 10*3/uL (ref 0.7–3.1)
Lymphs: 29 %
MCH: 32.8 pg (ref 26.6–33.0)
MCHC: 35.3 g/dL (ref 31.5–35.7)
MCV: 93 fL (ref 79–97)
Monocytes Absolute: 0.5 10*3/uL (ref 0.1–0.9)
Monocytes: 10 %
Neutrophils Absolute: 2.4 10*3/uL (ref 1.4–7.0)
Neutrophils: 52 %
Phosphorus: 2.8 mg/dL (ref 2.8–4.1)
Platelets: 206 10*3/uL (ref 150–450)
Potassium: 4.3 mmol/L (ref 3.5–5.2)
Prostate Specific Ag, Serum: 1.2 ng/mL (ref 0.0–4.0)
RBC: 5 x10E6/uL (ref 4.14–5.80)
RDW: 11.7 % (ref 11.6–15.4)
Sodium: 137 mmol/L (ref 134–144)
T3 Uptake Ratio: 30 % (ref 24–39)
T4, Total: 5.7 ug/dL (ref 4.5–12.0)
TSH: 0.668 u[IU]/mL (ref 0.450–4.500)
Total Protein: 6.6 g/dL (ref 6.0–8.5)
Triglycerides: 46 mg/dL (ref 0–149)
Uric Acid: 3.7 mg/dL — ABNORMAL LOW (ref 3.8–8.4)
VLDL Cholesterol Cal: 10 mg/dL (ref 5–40)
WBC: 4.7 10*3/uL (ref 3.4–10.8)
eGFR: 94 mL/min/{1.73_m2} (ref >59)

## 2022-03-21 ENCOUNTER — Telehealth: Payer: Self-pay | Admitting: Urology

## 2022-03-21 NOTE — Telephone Encounter (Signed)
Pt stopped by office this morning.  He is upset, he said he's left multiple messages on triage line with no return call. 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 He said Erlene Quan gave him  and he said he's no better.

## 2022-03-21 NOTE — Telephone Encounter (Signed)
Called spoke with patient blood in semen for about 3 weeks, appt scheduled with Larene Beach.

## 2022-03-25 ENCOUNTER — Encounter: Payer: Self-pay | Admitting: Physician Assistant

## 2022-03-25 ENCOUNTER — Ambulatory Visit: Payer: Self-pay | Admitting: Physician Assistant

## 2022-03-25 VITALS — BP 144/86 | HR 60 | Temp 98.2°F | Resp 12 | Ht 69.0 in | Wt 195.0 lb

## 2022-03-25 VITALS — BP 146/96 | HR 60 | Temp 98.2°F | Resp 12 | Ht 69.0 in | Wt 195.0 lb

## 2022-03-25 DIAGNOSIS — Z0000 Encounter for general adult medical examination without abnormal findings: Secondary | ICD-10-CM

## 2022-03-25 DIAGNOSIS — Z0289 Encounter for other administrative examinations: Secondary | ICD-10-CM

## 2022-03-25 NOTE — Progress Notes (Signed)
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 Burna Sis

## 2022-03-25 NOTE — Progress Notes (Signed)
Pt presents today to complete physical, Pt states depression is getting worse and wants to talk about maganing it. He feels like klonopin may be making him feel this way. He has stopped taking it in the mornings and only at night and he can tell a difference.

## 2022-03-25 NOTE — Progress Notes (Signed)
[facility] ____________________________________________   None    (approximate)  I have reviewed the triage vital signs and the nursing notes.   HISTORY  Chief Complaint No chief complaint on file.   HPI Derrick [name] is a 52 y.o. male patient presented annual physical exam.  Patient voiced concern for increased depression and elevated blood pressure.  Patient is currently taking Xanax for anxiety.  Patient states he feels more depressed and anxious at this time.  Patient is also followed by urologist for hematuria.  Patient is followed by gastroenterologist for hemorrhoids.         Past Medical History:  Diagnosis Date   Anxiety    Depression    History of kidney stones    Renal disorder    kidney stones   Sleep apnea     There are no problems to display for this patient.   Past Surgical History:  Procedure Laterality Date   CYSTOSCOPY/URETEROSCOPY/HOLMIUM LASER/STENT PLACEMENT Left 01/14/2018   Procedure: CYSTOSCOPY/URETEROSCOPY/HOLMIUM LASER/STENT PLACEMENT;  Surgeon: Hollice Espy, MD;  Location: [facility] OR[facility];  Service: Urology;  Laterality: Left;   VASECTOMY      Prior to Admission medications   Medication Sig Start Date End Date Taking? Authorizing Provider  clonazePAM (KLONOPIN) 0.25 MG disintegrating tablet Take 1 tablet (0.25 mg total) by mouth 2 (two) times daily. 09/24/21   Sable Feil, PA-C  hydrocortisone (ANUSOL-HC) 2.5 % rectal cream Apply topically. 02/11/22   [provider]  meloxicam (MOBIC) 7.5 MG tablet Take 1 tablet (7.5 mg total) by mouth daily. 11/27/21   Hollice Espy, MD  tamsulosin (FLOMAX) 0.4 MG CAPS capsule TAKE 1 CAPSULE(0.4 MG) BY MOUTH DAILY 11/28/21   Hollice Espy, MD    Allergies Patient has no known allergies.  Family History  Problem Relation Age of Onset   Renal cancer Neg Hx    Prostate cancer Neg Hx     Social History Social History   Tobacco Use   Smoking status:  Never   Smokeless tobacco: Current    Types: Snuff  Vaping Use   Vaping Use: Never used  Substance Use Topics   Alcohol use: Yes    Comment: occasionally   Drug use: Not Currently    Types: Marijuana    Review of Systems Constitutional: No fever/chills Eyes: No visual changes. ENT: No sore throat. Cardiovascular: Denies chest pain. Respiratory: Denies shortness of breath. Gastrointestinal: No abdominal pain.  No nausea, no vomiting.  No diarrhea.  No constipation. Genitourinary: Negative for dysuria. Musculoskeletal: Negative for back pain. Skin: Negative for rash. Neurological: Negative for headaches, focal weakness or numbness. Psychiatric: Anxiety and depression   ____________________________________________   PHYSICAL EXAM:  VITAL SIGNS: BP is 146/96, pulse 60, respiration 12, temperature 98.2, patient 90% O2 sat on room air.  Patient with 95 pounds and BMI is 28.80. Constitutional: Alert and oriented. Well appearing and in no acute distress. Eyes: Conjunctivae are normal. PERRL. EOMI. Head: Atraumatic. Nose: No congestion/rhinnorhea. Mouth/Throat: Mucous membranes are moist.  Oropharynx non-erythematous. Neck: No stridor. No cervical spine tenderness to palpation. Hematological/Lymphatic/Immunilogical: No cervical lymphadenopathy. Cardiovascular: Normal rate, regular rhythm. Grossly normal heart sounds.  Good peripheral circulation. Respiratory: Normal respiratory effort.  No retractions. Lungs CTAB. Gastrointestinal: Soft and nontender. No distention. No abdominal bruits. No CVA tenderness. Genitourinary: Deferred Musculoskeletal: No lower extremity tenderness nor edema.  No joint effusions. Neurologic:  Normal speech and language. No gross focal neurologic deficits are appreciated. No gait instability. Skin:  Skin is warm, dry and intact. No rash noted. Psychiatric: Mood and affect are normal. Speech and behavior are  normal.  ____________________________________________   LABS  ____________________________________________            Component Ref Range & Units 7 d ago (03/18/22) 3 mo ago (11/27/21) 5 mo ago (10/17/21) 5 mo ago (10/08/21) 1 yr ago (03/05/21) 1 yr ago (05/25/20) 2 yr ago (05/04/19)  Color, UA  yellow Yellow R Yellow R Amber Dark Amber medium yellow Yellow  Clarity, UA  clear   Clear Clear clear Clear  Glucose, UA Negative Negative   Negative Negative Negative Negative  Bilirubin, UA  neg Negative R Negative R Negative Negative negative Negative  Ketones, UA  neg Negative R Negative R Negative Negative negative Negative  Spec Grav, UA 1.010 - 1.025 1.025 1.010 R 1.020 R >=1.030 Abnormal  >=1.030 Abnormal  1.025 1.020  Blood, UA  pos Trace Abnormal  R Trace Abnormal  R 1 Negative trace Positive CM  Comment: 1+ 25 Ery/uL  pH, UA 5.0 - 8.0 6.0 6.0 R 6.0 R 6.0 6.0 6.0 6.5  Protein, UA Negative Negative Negative R Negative R Negative Negative Negative Negative  Urobilinogen, UA 0.2 or 1.0 E.U./dL 0.2   1.0 0.2 0.2 0.2  Nitrite, UA  neg Negative R Negative R Negative Negative negative Negative  Leukocytes, UA Negative Negative Negative Negative Negative Negative Negative Negative  Appearance  dark     dark   Odor          Resulting Agency   [facility]             Specimen Collected: 03/18/22 09:14 Last Resulted: 03/18/22 09:14      Lab Flowsheet      Order Details      View Encounter      Lab and Collection Details      Routing      Result History    View All Conversations on this Encounter      CM=Additional comments  R=Reference range differs from displayed range      Result Care Coordination   Patient Communication   Add Comments   Seen Back to Top      Other Results from 03/18/2022   Contains abnormal data CMP12+LP+TP+TSH+6AC+PSA+CBC. Order: [id number] Status: Final result      Visible to patient: Yes (seen)      Next appt: 03/28/2022 at 01:30 PM in  Urology [facility], PA-C)      Dx: Routine adult health maintenance    0 Result Notes           Component Ref Range & Units 7 d ago (03/18/22) 1 yr ago (03/05/21) 1 yr ago (05/25/20) 2 yr ago (05/04/19) 3 yr ago (12/22/18)  Glucose 70 - 99 mg/dL 90 93 91 R 99 R 90 R  Uric Acid 3.8 - 8.4 mg/dL 3.7 Low  4.2 CM 4.0 CM 4.3 CM 3.9 R, CM  Comment:            Therapeutic target for gout patients: <6.0  BUN 6 - 24 mg/dL '9 10 13 12 12  '$ Creatinine, Ser 0.76 - 1.27 mg/dL 0.97 1.07 1.01 1.06 1.07  eGFR >59 mL/min/1.73 94 84 90    BUN/Creatinine Ratio 9 - '20 9 9 13 11 11  '$ Sodium 134 - 144 mmol/L 137 140 138 138 139  Potassium 3.5 - 5.2 mmol/L 4.3 4.6 4.7 4.3 4.7  Chloride 96 - 106 mmol/L  102 104 101 101 104  Calcium 8.7 - 10.2 mg/dL 8.6 Low  8.8 8.9 9.1 8.9  Phosphorus 2.8 - 4.1 mg/dL 2.8 3.6 3.0 2.9 3.1  Total Protein 6.0 - 8.5 g/dL 6.6 7.0 6.8 7.1 7.1  Albumin 3.8 - 4.9 g/dL 4.2 4.4 4.2 4.5 R 4.6 R  Globulin, Total 1.5 - 4.5 g/dL 2.4 2.6 2.6 2.6 2.5  Albumin/Globulin Ratio 1.2 - 2.2 1.8 1.7 1.6 1.7 1.8  Bilirubin Total 0.0 - 1.2 mg/dL 0.7 0.7 0.8 0.6 1.0  Alkaline Phosphatase 44 - 121 IU/L 81 95 85 88 R 87 R  LDH 121 - 224 IU/L 139 152 168 151 170  AST 0 - 40 IU/L '13 10 15 16 16  '$ ALT 0 - 44 IU/L '9 11 16 19 15  '$ GGT 0 - 65 IU/L '15 18 22 21 21  '$ Iron 38 - 169 ug/dL 106 144 119 136 149  Cholesterol, Total 100 - 199 mg/dL 151 163 173 187 163  Triglycerides 0 - 149 mg/dL 46 50 51 43 57  HDL >39 mg/dL 53 58 55 61 51  VLDL Cholesterol Cal 5 - 40 mg/dL '10 10 10 8 11  '$ LDL Chol Calc (NIH) 0 - 99 mg/dL 88 95 108 High  118 High  101 High   Chol/HDL Ratio 0.0 - 5.0 ratio 2.8 2.8 CM 3.1 CM 3.1 CM 3.2 CM  Comment:                                   T. Chol/HDL Ratio                                             Men  Women                               1/2 Avg.Risk  3.4    3.3                                   Avg.Risk  5.0    4.4                                2X Avg.Risk  9.6    7.1                                 3X Avg.Risk 23.4   11.0  Estimated CHD Risk 0.0 - 1.0 times avg.  < 0.5  < 0.5 CM  < 0.5 CM  < 0.5 CM  < 0.5 CM  Comment: The CHD Risk is based on the T. Chol/HDL ratio. Other factors affect CHD Risk such as hypertension, smoking, diabetes, severe obesity, and family history of premature CHD.  TSH 0.450 - 4.500 uIU/mL 0.668 0.908 0.819 0.872 0.986  T4, Total 4.5 - 12.0 ug/dL 5.7 6.2 6.2 5.7 5.9  T3 Uptake Ratio 24 - 39 % 30 28 32 29 30  Free Thyroxine Index 1.2 - 4.9 1.7 1.7 2.0 1.7 1.8  Prostate Specific Ag, Serum 0.0 - 4.0 ng/mL 1.2 0.8 CM 0.7  CM 0.7 CM 0.7 CM  Comment: Roche ECLIA methodology. According to the American Urological Association, Serum PSA should decrease and remain at undetectable levels after radical prostatectomy. The AUA defines biochemical recurrence as an initial PSA value 0.2 ng/mL or greater followed by a subsequent confirmatory PSA value 0.2 ng/mL or greater. Values obtained with different assay methods or kits cannot be used interchangeably. Results cannot be interpreted as absolute evidence of the presence or absence of malignant disease.  WBC 3.4 - 10.8 x10E3/uL 4.7 6.1 5.3 4.7 5.2  RBC 4.14 - 5.80 x10E6/uL 5.00 5.38 5.18 5.42 5.35  Hemoglobin 13.0 - 17.7 g/dL 16.4 17.3 16.5 16.9 17.1  Hematocrit 37.5 - 51.0 % 46.5 50.4 48.2 50.7 49.1  MCV 79 - 97 fL 93 94 93 94 92  MCH 26.6 - 33.0 pg 32.8 32.2 31.9 31.2 32.0  MCHC 31.5 - 35.7 g/dL 35.3 34.3 34.2 33.3 34.8  RDW 11.6 - 15.4 % 11.7 11.8 11.9 11.8 11.7  Platelets 150 - 450 x10E3/uL 206 259 236 232 235  Neutrophils Not Estab. % 52 55 55 51 58  Lymphs Not Estab. % '29 27 24 28 25  '$ Monocytes Not Estab. % '10 9 11 11 10  '$ Eos Not Estab. % '7 7 8 7 5  '$ Basos Not Estab. % '1 1 1 1 1  '$ Neutrophils Absolute 1.4 - 7.0 x10E3/uL 2.4 3.3 2.9 2.4 3.0  Lymphocytes Absolute 0.7 - 3.1 x10E3/uL 1.4 1.6 1.3 1.3 1.3  Monocytes Absolute 0.1 - 0.9 x10E3/uL 0.5 0.6 0.6 0.5 0.5  EOS (ABSOLUTE) 0.0 - 0.4 x10E3/uL 0.4  0.4 0.4 0.3 0.3  Basophils Absolute 0.0 - 0.2 x10E3/uL 0.1 0.1 0.1 0.1 0.1  Immature Granulocytes Not Estab. % '1 1 1 2 1  '$ Immature             EKG  Bradycardic if 51 bpm, asymptomatic. ____________________________________________    ____________________________________________   INITIAL IMPRESSION / ASSESSMENT AND PLAN As part of my medical decision making, I reviewed the following data within the electronic MEDICAL RECORD NUMBER      No acute findings on physical exam.  Patient has borderline hypertension.  Patient will follow-up with psychiatry for depression.        ____________________________________________   FINAL CLINICAL IMPRESSION Well exam   ED Discharge Orders     None        Note:  This document was prepared using Dragon voice recognition software and may include unintentional dictation errors.

## 2022-03-25 NOTE — Progress Notes (Signed)
[facility]   ____________________________________________   None    (approximate)  I have reviewed the triage vital signs and the nursing notes.   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  Patient voiced concern for depression and elevated blood pressure.          Past Medical History:  Diagnosis Date   Anxiety    Depression    History of kidney stones    Renal disorder    kidney stones   Sleep apnea     There are no problems to display for this patient.   Past Surgical History:  Procedure Laterality Date   CYSTOSCOPY/URETEROSCOPY/HOLMIUM LASER/STENT PLACEMENT Left 01/14/2018   Procedure: CYSTOSCOPY/URETEROSCOPY/HOLMIUM LASER/STENT PLACEMENT;  Surgeon: Hollice Espy, MD;  Location: [facility] ORS;  Service: Urology;  Laterality: Left;   VASECTOMY      Prior to Admission medications   Medication Sig Start Date End Date Taking? Authorizing Provider  clonazePAM (KLONOPIN) 0.25 MG disintegrating tablet Take 1 tablet (0.25 mg total) by mouth 2 (two) times daily. 09/24/21  Yes Sable Feil, PA-C  hydrocortisone (ANUSOL-HC) 2.5 % rectal cream Apply topically. 02/11/22  Yes [provider]  meloxicam (MOBIC) 7.5 MG tablet Take 1 tablet (7.5 mg total) by mouth daily. 11/27/21  Yes Hollice Espy, MD  tamsulosin (FLOMAX) 0.4 MG CAPS capsule TAKE 1 CAPSULE(0.4 MG) BY MOUTH DAILY 11/28/21  Yes Hollice Espy, MD    Allergies Patient has no known allergies.  Family History  Problem Relation Age of Onset   Renal cancer Neg Hx    Prostate cancer Neg Hx     Social History Social History   Tobacco Use   Smoking status: Never   Smokeless tobacco: Current    Types: Snuff  Vaping Use   Vaping Use: Never used  Substance Use Topics   Alcohol use: Yes    Comment: occasionally   Drug use: Not Currently    Types: Marijuana    Review  of Systems Constitutional: No fever/chills Eyes: No visual changes. ENT: No sore throat. Cardiovascular: Denies chest pain. Respiratory: Denies shortness of breath. Gastrointestinal: No abdominal pain.  No nausea, no vomiting.  No diarrhea.  No constipation. Genitourinary: Negative for dysuria. Musculoskeletal: Negative for back pain. Skin: Negative for rash. Neurological: Negative for headaches, focal weakness or numbness. Psychiatric: Depression  ____________________________________________   PHYSICAL EXAM:  VITAL SIGNS: Blood pressure is 146/96, pulse 60, respiration 12, temperature 98.2, patient 98% O2 sat on room air.  Patient weighs 195 pounds and BMI is 28.80. Constitutional: Alert and oriented. Well appearing and in no acute distress. Eyes: Conjunctivae are normal. PERRL. EOMI. Head: Atraumatic. Nose: No congestion/rhinnorhea. Mouth/Throat: Mucous membranes are moist.  Oropharynx non-erythematous. Neck: No stridor.  No cervical spine tenderness to palpation. Hematological/Lymphatic/Immunilogical: No cervical lymphadenopathy. Cardiovascular: Normal rate, regular rhythm. Grossly normal heart sounds.  Good peripheral circulation.  Elevated blood pressure 146/96 Respiratory: Normal respiratory effort.  No retractions. Lungs CTAB. Gastrointestinal: Soft and nontender. No distention. No abdominal bruits. No CVA tenderness. Genitourinary: Deferred Musculoskeletal: No lower extremity tenderness nor edema.  No joint effusions. Neurologic:  Normal speech and language. No gross focal neurologic deficits are appreciated. No gait instability. Skin:  Skin is warm, dry and intact. No rash noted. Psychiatric: Mood and affect are normal. Speech and behavior are normal.  ____________________________________________   LABS ____________________________________________  Component Ref Range & Units 7 d ago (03/18/22) 3 mo ago (11/27/21) 5 mo ago (10/17/21) 5 mo ago (10/08/21)  1 yr ago (03/05/21) 1 yr ago (05/25/20) 2 yr ago (05/04/19)  Color, UA  yellow Yellow R Yellow R Amber Dark Amber medium yellow Yellow  Clarity, UA  clear   Clear Clear clear Clear  Glucose, UA Negative Negative   Negative Negative Negative Negative  Bilirubin, UA  neg Negative R Negative R Negative Negative negative Negative  Ketones, UA  neg Negative R Negative R Negative Negative negative Negative  Spec Grav, UA 1.010 - 1.025 1.025 1.010 R 1.020 R >=1.030 Abnormal  >=1.030 Abnormal  1.025 1.020  Blood, UA  pos Trace Abnormal  R Trace Abnormal  R 1 Negative trace Positive CM  Comment: 1+ 25 Ery/uL  pH, UA 5.0 - 8.0 6.0 6.0 R 6.0 R 6.0 6.0 6.0 6.5  Protein, UA Negative Negative Negative R Negative R Negative Negative Negative Negative  Urobilinogen, UA 0.2 or 1.0 E.U./dL 0.2   1.0 0.2 0.2 0.2  Nitrite, UA  neg Negative R Negative R Negative Negative negative Negative  Leukocytes, UA Negative Negative Negative Negative Negative Negative Negative Negative                  ____________________________________________   ____________________________________________   INITIAL IMPRESSION / ASSESSMENT AND PLAN   As part of my medical decision making, I reviewed the following data within the electronic MEDICAL RECORD NUMBER      No acute findings on physical exam.  Except for elevated blood pressure.  Patient will follow-up with 3-day blood pressure check.  If elevated will consider medication in 52-monthfollow-up.  Patient will follow EAP for depression.        ____________________________________________   FINAL CLINICAL IMPRESSION  Well exam except for elevated blood pressure.   ED Discharge Orders     None        Note:  This document was prepared using Dragon voice recognition software and may include unintentional dictation errors.

## 2022-03-26 ENCOUNTER — Ambulatory Visit: Payer: Self-pay

## 2022-03-26 VITALS — BP 117/73 | HR 56

## 2022-03-26 DIAGNOSIS — Z0130 Encounter for examination of blood pressure without abnormal findings: Secondary | ICD-10-CM

## 2022-03-26 NOTE — Progress Notes (Signed)
Pt presents today to complete BP check.

## 2022-03-27 ENCOUNTER — Ambulatory Visit: Payer: Self-pay

## 2022-03-27 VITALS — BP 132/81 | HR 51 | Resp 12

## 2022-03-27 DIAGNOSIS — Z0130 Encounter for examination of blood pressure without abnormal findings: Secondary | ICD-10-CM

## 2022-03-27 NOTE — Progress Notes (Signed)
Pt presents today to complete Bpcheck.

## 2022-03-27 NOTE — Progress Notes (Unsigned)
03/28/2022 2:18 PM   Derrick [name] 1970/06/27 [id number]  Referring provider: Sable Feil, PA-C [address] [place],  [place] [address]  Urological history: 1.  Nephrolithiasis -Still composition of 50% calcium oxalate monohydrate, 30% calcium phosphate and 20% calcium oxalate dihydrate -left URS (2019)   2. High risk hematuria -No cigarette smoking, but marijuana use -CTU (2019) -nephrolithiasis -cysto (2019) - NED -no reports of gross heme -UA 3-10 RBC's  3.  Prostatitis -Inflammatory  4. Hematospermia  5. BPH  -PSA (02/2022) 1.2   Chief Complaint  Patient presents with   Acute Visit    Blood in semen    HPI: Derrick [name] is a 52 y.o. male who presents today for blood in semen and groin pain.  Around first week in February he masturbated and when he ejaculated he noticed the same and look like it had fresh blood in it and then he had intercourse with his wife and noted the same phenomenon.  There is no pain with ejaculation or intercourse.  He also does not have any urinary symptoms.  He denies any pain or swelling in his testicles.  He has not had any episodes of perineal pain.  He has had very mild intermittent short lasting groin pain that mostly occurs when ingesting himself.  Patient denies any modifying or aggravating factors.  Patient denies any gross hematuria, dysuria or suprapubic/flank pain.  Patient denies any fevers, chills, nausea or vomiting.    UA yellow clear, specific gravity 1.010, pH 6.0, trace RBCs, 0-5 WBCs, 3-10 RBCs and 0-10 epithelial cells.  PVR 66 mL   KUB faint calcification just below the level of L2 on the right   PMH: Past Medical History:  Diagnosis Date   Anxiety    Depression    History of kidney stones    Renal disorder    kidney stones   Sleep apnea     Surgical History: Past Surgical History:  Procedure Laterality Date   CYSTOSCOPY/URETEROSCOPY/HOLMIUM LASER/STENT PLACEMENT Left 01/14/2018    Procedure: CYSTOSCOPY/URETEROSCOPY/HOLMIUM LASER/STENT PLACEMENT;  Surgeon: Hollice Espy, MD;  Location: [facility] ORS;  Service: Urology;  Laterality: Left;   VASECTOMY      Home Medications:  Allergies as of 03/28/2022   No Known Allergies      Medication List        Accurate as of March 28, 2022  2:18 PM. If you have any questions, ask your nurse or doctor.          clonazePAM 0.25 MG disintegrating tablet Commonly known as: KLONOPIN Take 1 tablet (0.25 mg total) by mouth 2 (two) times daily.   hydrocortisone 2.5 % rectal cream Commonly known as: ANUSOL-HC Apply topically.   meloxicam 7.5 MG tablet Commonly known as: Mobic Take 1 tablet (7.5 mg total) by mouth daily.   tamsulosin 0.4 MG Caps capsule Commonly known as: FLOMAX TAKE 1 CAPSULE(0.4 MG) BY MOUTH DAILY        Allergies: No Known Allergies  Family History: Family History  Problem Relation Age of Onset   Renal cancer Neg Hx    Prostate cancer Neg Hx     Social History:  reports that he has never smoked. He has never been exposed to tobacco smoke. His smokeless tobacco use includes snuff. He reports current alcohol use. He reports that he does not currently use drugs after having used the following drugs: Marijuana.  ROS: Pertinent ROS in HPI  Physical Exam: Ht '5\' 9"'$  (1.753  m)   Wt 198 lb (89.8 kg)   BMI 29.24 kg/m   Constitutional:  Well nourished. Alert and oriented, No acute distress. HEENT: [place] AT, moist mucus membranes.  Trachea midline Cardiovascular: No clubbing, cyanosis, or edema. Respiratory: Normal respiratory effort, no increased work of breathing. GU: No CVA tenderness.  No bladder fullness or masses.  Patient with circumcised phallus.  Urethral meatus is patent.  No penile discharge. No penile lesions or rashes. Scrotum without lesions, cysts, rashes and/or edema.  Testicles are located scrotally bilaterally. No masses are appreciated in the testicles. Left and right epididymis are  normal.  Left varicoceles noted Rectal: Patient with  normal sphincter tone. Anus and perineum without scarring or rashes. No rectal masses are appreciated. Prostate is approximately 45 grams, no nodules are appreciated. Seminal vesicles could not be palpated. Neurologic: Grossly intact, no focal deficits, moving all 4 extremities. Psychiatric: Normal mood and affect.  Laboratory Data: Lab Results  Component Value Date   WBC 4.7 03/18/2022   HGB 16.4 03/18/2022   HCT 46.5 03/18/2022   MCV 93 03/18/2022   PLT 206 03/18/2022    Lab Results  Component Value Date   CREATININE 0.97 03/18/2022    Lab Results  Component Value Date   TSH 0.668 03/18/2022       Component Value Date/Time   CHOL 151 03/18/2022 0834   HDL 53 03/18/2022 0834   CHOLHDL 2.8 03/18/2022 0834   LDLCALC 88 03/18/2022 0834    Lab Results  Component Value Date   AST 13 03/18/2022   Lab Results  Component Value Date   ALT 9 03/18/2022    Urinalysis Component     Latest Ref Rng 03/28/2022  Glucose, UA     Negative  Negative   Leukocytes,UA     Negative  Negative   Specific Gravity, UA     1.005 - 1.030  1.010   pH, UA     5.0 - 7.5  6.0   Color, UA     Yellow  Yellow   Appearance Ur     Clear  Clear   Protein,UA     Negative/Trace  Negative   Ketones, UA     Negative  Negative   RBC, UA     Negative  Trace !   Bilirubin, UA     Negative  Negative   Urobilinogen, Ur     0.2 - 1.0 mg/dL 0.2   Nitrite, UA     Negative  Negative   Microscopic Examination See below:     Legend: ! Abnormal  Component     Latest Ref Rng 03/28/2022  WBC, UA     0 - 5 /hpf 0-5   Bacteria, UA     None seen/Few  None seen   Epithelial Cells (non renal)     0 - 10 /hpf 0-10   RBC, Urine     0 - 2 /hpf 3-10 !     Legend: ! Abnormal I have reviewed the labs.   Pertinent Imaging:  03/28/22 13:38  Scan Result 17m  I have independently reviewed the films.    Assessment & Plan:    1.  Hematospermia - I explained to the patient some of the conditions that may cause hematospermia, such as: disorders of the prostate gland, seminal vesicles, spermatic cord, and ejaculatory duct system; urogenital infections including sexually transmitted infections (eg, chlamydia, herpes simplex virus, gonorrhea, trichomonas); metastatic cancers; vascular malformations; congenital and drug-induced bleeding disorders;  and even frequent daily ejaculation over a period of several weeks.  I reassured him that his exam was normal and that we may never discover a reason for his hematospermia and it is most likely a benign symptom.   -It may be the result of his varicocele and/or infrequent ejaculation and he may choose to ejaculate more than 2-3 times monthly  2. Groin pain -very mild -May be secondary to varicoceles  3.  Microscopic hematuria -UA with 3-10 RBC's -Urine sent for culture -Discussed that repeating hematuria workup without increase severity of the hematuria is typically of low yield, but since it been almost 3 years since his last workup he may choose to proceed -He expressed his desire to be less aggressive as he feels he has had microscopic blood in his urine for several years  4.  Nephrolithiasis -Faint calcification is located in the proximity of the right proximal ureter on today's KUB -We will have him return in 2 weeks for repeat KUB and UA to see if this opacification persists or if he should develop symptoms of right renal colic    Return in about 2 weeks (around 04/11/2022) for KUB and UA .  These notes generated with voice recognition software. I apologize for typographical errors.  [place], [place] [address]  [place], [place] [address] [phone]

## 2022-03-28 ENCOUNTER — Encounter: Payer: Self-pay | Admitting: Physician Assistant

## 2022-03-28 ENCOUNTER — Ambulatory Visit: Payer: Self-pay | Admitting: Physician Assistant

## 2022-03-28 ENCOUNTER — Ambulatory Visit
Admission: RE | Admit: 2022-03-28 | Discharge: 2022-03-28 | Disposition: A | Discharge status: Home / Self Care | Payer: 59 | Source: Ambulatory Visit | Attending: Urology | Admitting: Urology

## 2022-03-28 ENCOUNTER — Other Ambulatory Visit: Payer: Self-pay | Admitting: *Deleted

## 2022-03-28 ENCOUNTER — Encounter: Payer: Self-pay | Admitting: Urology

## 2022-03-28 ENCOUNTER — Ambulatory Visit (INDEPENDENT_AMBULATORY_CARE_PROVIDER_SITE_OTHER): Payer: 59 | Admitting: Urology

## 2022-03-28 VITALS — BP 138/83 | HR 58 | Temp 97.8°F | Resp 12

## 2022-03-28 VITALS — Ht 69.0 in | Wt 198.0 lb

## 2022-03-28 DIAGNOSIS — R3129 Other microscopic hematuria: Secondary | ICD-10-CM | POA: Diagnosis not present

## 2022-03-28 DIAGNOSIS — R361 Hematospermia: Secondary | ICD-10-CM

## 2022-03-28 DIAGNOSIS — R1032 Left lower quadrant pain: Secondary | ICD-10-CM | POA: Diagnosis not present

## 2022-03-28 DIAGNOSIS — R319 Hematuria, unspecified: Secondary | ICD-10-CM | POA: Diagnosis not present

## 2022-03-28 DIAGNOSIS — R1031 Right lower quadrant pain: Secondary | ICD-10-CM

## 2022-03-28 DIAGNOSIS — F4323 Adjustment disorder with mixed anxiety and depressed mood: Secondary | ICD-10-CM

## 2022-03-28 DIAGNOSIS — N200 Calculus of kidney: Secondary | ICD-10-CM

## 2022-03-28 LAB — URINALYSIS, COMPLETE
Bilirubin, UA: NEGATIVE
Glucose, UA: NEGATIVE
Ketones, UA: NEGATIVE
Leukocytes,UA: NEGATIVE
Nitrite, UA: NEGATIVE
Protein,UA: NEGATIVE
Specific Gravity, UA: 1.01 (ref 1.005–1.030)
Urobilinogen, Ur: 0.2 mg/dL (ref 0.2–1.0)
pH, UA: 6 (ref 5.0–7.5)

## 2022-03-28 LAB — MICROSCOPIC EXAMINATION: Bacteria, UA: NONE SEEN

## 2022-03-28 LAB — BLADDER SCAN AMB NON-IMAGING

## 2022-03-28 MED ORDER — CITALOPRAM HYDROBROMIDE 20 MG PO TABS: 20.0000 mg | ORAL_TABLET | Freq: Every day | ORAL | 3 refills | Status: DC

## 2022-03-28 NOTE — Progress Notes (Signed)
   Subjective: Anxiety    Patient ID: JERIKO SOLTERO, male    DOB: September 26, 1970, 52 y.o.   MRN: [id number]  HPI Patient has weaned himself off Xanax past 3 weeks that he was using for anxiety.  Patient states in the past 4 months he has developed a depressive state with intermittent anxiety.  Patient is scheduled appointment for psychiatry but cannot be seen until May 2024.  Review of Systems Anxiety and depression    Objective:   Physical Exam BP is 138/83, pulse 58, respiration 12, and temperature is 97.8.  Patient 1% O2 sat on room air. HEENT is unremarkable. Neck is supple followed lymphadenectomy or bruits.  Lungs are clear to auscultation.  Heart regular rate and rhythm.  Abdomen with negative testing, normoactive bowel sounds, soft, nontender to palpation.      Assessment & Plan: Anxiety and depression   Patient amenable to a trial of Celexa.  Will start at 20 mg daily and follow-up in 2 weeks.  Advised might have to titrate up to 40 mg.

## 2022-03-28 NOTE — Addendum Note (Signed)
Addended by: Aliene Altes on: 03/28/2022 03:46 PM   Modules accepted: Orders

## 2022-03-28 NOTE — Progress Notes (Signed)
Wants to come off of Klonopin - Rx expired 03/23/22 Helping anxiety, not helping depression ? About going back on Celexa  AMD

## 2022-04-02 LAB — CULTURE, URINE COMPREHENSIVE

## 2022-04-03 ENCOUNTER — Emergency Department: Payer: 59

## 2022-04-03 ENCOUNTER — Emergency Department
Admission: EM | Admit: 2022-04-03 | Discharge: 2022-04-03 | Disposition: A | Discharge status: Home / Self Care | Payer: 59 | Attending: Emergency Medicine | Admitting: Emergency Medicine

## 2022-04-03 DIAGNOSIS — R0789 Other chest pain: Secondary | ICD-10-CM | POA: Diagnosis not present

## 2022-04-03 DIAGNOSIS — I10 Essential (primary) hypertension: Secondary | ICD-10-CM | POA: Diagnosis not present

## 2022-04-03 DIAGNOSIS — R0602 Shortness of breath: Secondary | ICD-10-CM | POA: Diagnosis not present

## 2022-04-03 DIAGNOSIS — R079 Chest pain, unspecified: Secondary | ICD-10-CM | POA: Diagnosis not present

## 2022-04-03 DIAGNOSIS — R110 Nausea: Secondary | ICD-10-CM | POA: Diagnosis not present

## 2022-04-03 LAB — BASIC METABOLIC PANEL WITH GFR
Anion gap: 8 (ref 5–15)
BUN: 9 mg/dL (ref 6–20)
CO2: 22 mmol/L (ref 22–32)
Calcium: 8.6 mg/dL — ABNORMAL LOW (ref 8.9–10.3)
Chloride: 105 mmol/L (ref 98–111)
Creatinine, Ser: 0.88 mg/dL (ref 0.61–1.24)
GFR, Estimated: >60 mL/min
Glucose, Bld: 106 mg/dL — ABNORMAL HIGH (ref 70–99)
Potassium: 3.5 mmol/L (ref 3.5–5.1)
Sodium: 135 mmol/L (ref 135–145)

## 2022-04-03 LAB — CBC
HCT: 48 % (ref 39.0–52.0)
Hemoglobin: 16.8 g/dL (ref 13.0–17.0)
MCH: 31.6 pg (ref 26.0–34.0)
MCHC: 35 g/dL (ref 30.0–36.0)
MCV: 90.4 fL (ref 80.0–100.0)
Platelets: 226 10*3/uL (ref 150–400)
RBC: 5.31 MIL/uL (ref 4.22–5.81)
RDW: 11.7 % (ref 11.5–15.5)
WBC: 6 10*3/uL (ref 4.0–10.5)
nRBC: 0.3 % — ABNORMAL HIGH (ref 0.0–0.2)

## 2022-04-03 LAB — TROPONIN I (HIGH SENSITIVITY)
Troponin I (High Sensitivity): 3 ng/L
Troponin I (High Sensitivity): 3 ng/L (ref <18)

## 2022-04-03 NOTE — ED Provider Notes (Signed)
[facility] Provider Note    Event Date/Time   First MD Initiated Contact with Patient 04/03/22 1229     (approximate)   History   Chest Pain   HPI  Derrick [name] is a 52 y.o. male with history of anxiety, depression, and kidney stones who presents with episodic chest pain over the last week.  The patient states that he recently weaned off of clonazepam and started on Celexa.  His last dose of clonazepam was 1 week ago.  He had elevated blood pressure associated with coming off of the clonazepam.  He also recently was started on Metamucil for constipation and has been having some intermittent indigestion.  He had an episode of chest pain yesterday that he attributed to likely indigestion.  He had another episode that started today when he woke up and got worse when he was at work.  He had associated shortness of breath but no nausea or vomiting.  The patient states that the pain is almost completely resolved at this time.  I reviewed the past medical records.  The patient's most recent outpatient encounter was with occupational health on 2/29 and reported anxiety and depression at that time.  He was also seen on 2/29 by urology for hematospermia.   Physical Exam   Triage Vital Signs: ED Triage Vitals  Enc Vitals Group     BP 04/03/22 1232 (!) 175/112     Pulse Rate 04/03/22 1232 (!) 58     Resp 04/03/22 1232 13     Temp 04/03/22 1232 98.7 F (37.1 C)     Temp Source 04/03/22 1232 Oral     SpO2 04/03/22 1232 98 %     Weight 04/03/22 1231 198 lb (89.8 kg)     Height --      Head Circumference --      Peak Flow --      Pain Score 04/03/22 1230 3     Pain Loc --      Pain Edu? --      Excl. in [place]? --     Most recent vital signs: Vitals:   04/03/22 1430 04/03/22 1500  BP: 139/86 (!) 140/83  Pulse: (!) 53 (!) 48  Resp: 17 16  Temp:    SpO2: 96% 97%     General: Awake, no distress.  CV:  Good peripheral perfusion.  Normal heart  sounds. Resp:  Normal effort.  Lungs CTAB. Abd:  No distention.  Other:  No peripheral edema.   ED Results / Procedures / Treatments   Labs (all labs ordered are listed, but only abnormal results are displayed) Labs Reviewed  BASIC METABOLIC PANEL - Abnormal; Notable for the following components:      Result Value   Glucose, Bld 106 (*)    Calcium 8.6 (*)    All other components within normal limits  CBC - Abnormal; Notable for the following components:   nRBC 0.3 (*)    All other components within normal limits  TROPONIN I (HIGH SENSITIVITY)  TROPONIN I (HIGH SENSITIVITY)     EKG  ED ECG REPORT I, Arta Silence, the attending physician, personally viewed and interpreted this ECG.  Date: 04/03/2022 EKG Time: 1233 Rate: 57 Rhythm: normal sinus rhythm QRS Axis: normal Intervals: normal ST/T Wave abnormalities: Early repolarization Narrative Interpretation: no evidence of acute ischemia; no significant change when compared EKG of 03/18/2022    RADIOLOGY  Chest x-ray: I independently viewed and interpreted the images;  there is no focal consolidation or edema  PROCEDURES:  Critical Care performed: No  Procedures   MEDICATIONS ORDERED IN ED: Medications - No data to display   IMPRESSION / MDM / [place] / ED COURSE  I reviewed the triage vital signs and the nursing notes.  52 year old male with PMH as noted above presents with intermittent atypical chest pain associated with indigestion symptoms and starting after he discontinued Klonopin and was started on Celexa.  He had an episode of pain this morning that is now almost completely resolved.  Differential diagnosis includes, but is not limited to, GERD, gastritis, musculoskeletal pain, hypertension related symptoms, less likely benzodiazepine withdrawal.  The patient has no tremor, tongue fasciculation, or tachycardia to suggest benzodiazepine withdrawal at this time.  I have a low suspicion for  ACS.  The patient has no significant cardiac history.  There is no evidence for PE or for vascular cause given that the pain has resolved on its own.  We will obtain basic labs, cardiac enzymes x 2, and reassess.  Patient's presentation is most consistent with acute presentation with potential threat to life or bodily function.  The patient is on the cardiac monitor to evaluate for evidence of arrhythmia and/or significant heart rate changes.  ----------------------------------------- 3:58 PM on 04/03/2022 -----------------------------------------  Troponins are negative x 2.  Electrolytes are unremarkable.  There is no leukocytosis or anemia.  The patient has remained asymptomatic and his blood pressure has improved without intervention.  He is stable for discharge home at this time.  I have given a cardiology referral although I suspect that his symptoms are more likely GI related.  He has been taking Pepcid with some relief.  The patient is stable for discharge home.  I gave him strict return precautions and he expressed understanding.   FINAL CLINICAL IMPRESSION(S) / ED DIAGNOSES   Final diagnoses:  Atypical chest pain     Rx / DC Orders   ED Discharge Orders          Ordered    Ambulatory referral to Cardiology       Comments: If you have not heard from the Cardiology office within the next 72 hours please call [phone].   04/03/22 1558             Note:  This document was prepared using Dragon voice recognition software and may include unintentional dictation errors.    Arta Silence, MD 04/03/22 [phone]

## 2022-04-03 NOTE — Discharge Instructions (Addendum)
Follow up with your primary care provider and with cardiology.  You may continue to take Pepcid.  Return to the ER for new, worsening, or persistent severe chest pain, difficulty breathing, weakness or lightheadedness, or any other new or worsening symptoms that concern you.

## 2022-04-03 NOTE — ED Triage Notes (Addendum)
Pt arrives via EMS from work. Per pt, he woke up with CP and it started to go away so he went into work. Pt than sts that it got worse and called 911. Pt sts that they have been adjusting his anxiety meds and thinks that may be a cause. Pt also sts that he has been having blood in his urine and semen. Pt sts that he has been seeing Dr. Erlene Quan over at [facility].

## 2022-04-04 ENCOUNTER — Telehealth: Payer: Self-pay

## 2022-04-04 NOTE — Telephone Encounter (Signed)
Returned call to pt to relay provider response.

## 2022-04-05 ENCOUNTER — Telehealth: Payer: Self-pay

## 2022-04-05 NOTE — Telephone Encounter (Signed)
Randel Pigg, PA-C called with the following: Stop the [place] scheduled appointment with Cardiologist 3.  Schedule appointment with Derrick [name] after he sees cardiologist  [facility] & informed him of Derrick [name]'s recommendations. Ok with stopping medication. Appt scheduled for 04/10/22 at 11:15 am with [name], PA-C  AMD

## 2022-04-05 NOTE — Telephone Encounter (Signed)
Started back on Celexa 20 mg on 03/28/2022.  Ahmaud weaned himself off of Klonopin.  Requested to be put back on Celexa.  Documentation from paper chart in the clinic - originally prescribed Celexa 20 mg 04/08/07 by Dr. Shepard [facility].  Dose increased to 40 mg on 10/25/09 by Dr. Shepard [facility].  Remained on Celexa until sometime in 2022 when Aiden weaned himself off.       04/03/22 Martin Majestic to [facility] ED with C/O episodic chest pain, indigestion, chest tightness, SOB & dizziness.  Today 04/05/22 reports new side effect - bleeding gums along with feeling "hung over", nauseated (not wanting to eat anything) & increased anxiety. Reports chest tightness & heart racing almost resolved.  Concerned about the bleeding gums & how he's tolerating the Celexa.  Doesn't understand why he's experiencing these side effects since he was on the medication for years & at a higher dose.  Will advise Randel Pigg, PA-C of the above developments.  AMD

## 2022-04-08 NOTE — Progress Notes (Unsigned)
No chief complaint on file.  History of Present Illness: 52 yo Derrick [name] with history of anxiety, depression and sleep apnea who is here today as a new consult, referred by Dr. Cherylann Banas, for the evaluation of chest pain. He was seen in the [facility] ED on 04/03/22 with chest pain.  Troponin negative x 2. EKG with sinus, early repolarization. CHest x-ray clear. His pain was not felt to be cardiac related. He reported having been recently weaned off of clonazepam and started on Celexa. He reported elevated blood pressures at home. He reported constipation and symptoms consistent with acid reflux. He tells me today that he ***  Primary Care Physician: Hewitt Blade   Past Medical History:  Diagnosis Date   Anxiety    Depression    History of kidney stones    Renal disorder    kidney stones   Sleep apnea     Past Surgical History:  Procedure Laterality Date   CYSTOSCOPY/URETEROSCOPY/HOLMIUM LASER/STENT PLACEMENT Left 01/14/2018   Procedure: CYSTOSCOPY/URETEROSCOPY/HOLMIUM LASER/STENT PLACEMENT;  Surgeon: Hollice Espy, MD;  Location: [facility] ORS;  Service: Urology;  Laterality: Left;   VASECTOMY      Current Outpatient Medications  Medication Sig Dispense Refill   citalopram (CELEXA) 20 MG tablet Take 1 tablet (20 mg total) by mouth daily. 30 tablet 3   clonazePAM (KLONOPIN) 0.25 MG disintegrating tablet Take 1 tablet (0.25 mg total) by mouth 2 (two) times daily. 60 tablet 5   hydrocortisone (ANUSOL-HC) 2.5 % rectal cream Apply topically.     tamsulosin (FLOMAX) 0.4 MG CAPS capsule TAKE 1 CAPSULE(0.4 MG) BY MOUTH DAILY 90 capsule 1   No current facility-administered medications for this visit.    No Known Allergies  Social History   Socioeconomic History   Marital status: Married    Spouse name: Not on file   Number of children: Not on file   Years of education: Not on file   Highest education level: Not on file  Occupational History   Not on file  Tobacco Use   Smoking  status: Never    Passive exposure: Never   Smokeless tobacco: Current    Types: Snuff  Vaping Use   Vaping Use: Never used  Substance and Sexual Activity   Alcohol use: Yes    Comment: occasionally   Drug use: Not Currently    Types: Marijuana   Sexual activity: Not on file  Other Topics Concern   Not on file  Social History Narrative   Not on file   Social Determinants of Health   Financial Resource Strain: Not on file  Food Insecurity: Not on file  Transportation Needs: Not on file  Physical Activity: Not on file  Stress: Not on file  Social Connections: Not on file  Intimate Partner Violence: Not on file    Family History  Problem Relation Age of Onset   Renal cancer Neg Hx    Prostate cancer Neg Hx     Review of Systems:  As stated in the HPI and otherwise negative.   There were no vitals taken for this visit.  Physical Examination: General: Well developed, well nourished, NAD  HEENT: OP clear, mucus membranes moist  SKIN: warm, dry. No rashes. Neuro: No focal deficits  Musculoskeletal: Muscle strength 5/5 all ext  Psychiatric: Mood and affect normal  Neck: No JVD, no carotid bruits, no thyromegaly, no lymphadenopathy.  Lungs:Clear bilaterally, no wheezes, rhonci, crackles Cardiovascular: Regular rate and rhythm. No murmurs, gallops or  rubs. Abdomen:Soft. Bowel sounds present. Non-tender.  Extremities: No lower extremity edema. Pulses are 2 + in the bilateral DP/PT.  EKG:  EKG {ACTION; IS/IS GI:[id number] ordered today. The ekg ordered today demonstrates ***  Recent Labs: 03/18/2022: ALT 9; TSH 0.668 04/03/2022: BUN 9; Creatinine, Ser 0.88; Hemoglobin 16.8; Platelets 226; Potassium 3.5; Sodium 135   Lipid Panel    Component Value Date/Time   CHOL 151 03/18/2022 0834   TRIG 46 03/18/2022 0834   HDL 53 03/18/2022 0834   CHOLHDL 2.8 03/18/2022 0834   LDLCALC 88 03/18/2022 0834     Wt Readings from Last 3 Encounters:  04/03/22 89.8 kg  03/28/22 89.8  kg  03/25/22 88.5 kg      Assessment and Plan:   1.   Labs/ tests ordered today include:  No orders of the defined types were placed in this encounter.    Disposition:   F/U with me in ***    Signed, Lauree Chandler, MD, [facility] 04/08/2022 7:07 PM    [facility] [place], [place], [place]  [address] Phone: [phone]; Fax: [phone]

## 2022-04-09 ENCOUNTER — Ambulatory Visit: Payer: 59 | Attending: Cardiovascular Disease | Admitting: Cardiovascular Disease

## 2022-04-09 ENCOUNTER — Encounter: Payer: Self-pay | Admitting: Cardiovascular Disease

## 2022-04-09 VITALS — BP 120/78 | HR 53 | Ht 69.0 in | Wt 194.0 lb

## 2022-04-09 DIAGNOSIS — R079 Chest pain, unspecified: Secondary | ICD-10-CM

## 2022-04-09 NOTE — Patient Instructions (Addendum)
Medication Instructions:  No changes *If you need a refill on your cardiac medications before your next appointment, please call your pharmacy*   Lab Work: none   Testing/Procedures: None  Follow-Up: 3-4 months with Dr. Angelena Form

## 2022-04-10 ENCOUNTER — Ambulatory Visit: Payer: Self-pay | Admitting: Physician Assistant

## 2022-04-10 ENCOUNTER — Encounter: Payer: Self-pay | Admitting: Physician Assistant

## 2022-04-10 VITALS — BP 124/82 | HR 66 | Temp 98.0°F | Resp 12

## 2022-04-10 DIAGNOSIS — F4323 Adjustment disorder with mixed anxiety and depressed mood: Secondary | ICD-10-CM

## 2022-04-10 MED ORDER — HYDROXYZINE PAMOATE 25 MG PO CAPS: 50.0000 mg | ORAL_CAPSULE | Freq: Three times a day (TID) | ORAL | 0 refills | Status: AC | PRN

## 2022-04-10 NOTE — Progress Notes (Signed)
   Subjective: Anxiety    Patient ID: Derrick [name], male    DOB: 21-Jan-1971, 52 y.o.   MRN: [id number]  HPI Patient presents for continued anxiety.  Patient was seen at the emergency room and also by cardiologist for suspected heart involvement.  No intervention at the emergency room and patient was cleared by cardiology to follow-up appointment.  Patient is unsure what medication he feels comfortable taking.  Patient has an appointment in May 2024 close psychiatry.  In the past patient is taking Xanax and has failed taking Klonopin.  Patient and wife relates that there of family situations which she cannot resolve which increases anxiety.  Patient also had tried to stop taking caffeine products and dipping tobacco in the timeframe that he went to the emergency room.   Review of Systems Anxiety    Objective:   Physical Exam BP is 124/82, pulse 66, respiration 12, temperature 98, and patient is 95% O2 sat on room air. Patient no acute distress.  Review of his blood pressure log revealed normotensive readings at the evaluation by cardiology.       Assessment & Plan: Anxiety   Advised patient I am hesitant to start any medicines that are an SSRI class or benzodiazepines.  Patient is amenable to a trial of Atarax to 50 mg as needed every 8 hours.  Patient will follow-up in 2 weeks.

## 2022-04-10 NOTE — Progress Notes (Signed)
Has a BP log with him  3/1 - Started Celexa - increased anxiety, insomnia 3/2 bad 3/3 some improvement 3/4 OK - still not sleeping, nauseated, wasn't dipping (withdrawals), no caffeine (coffee & diet mt dew); indigestion, gas, BP 14/82 3/6 - Went to ED - overwhelming & felt like he was going to die, EMS (non-emergency), BP 161/90 12:30 BP 175/113 - machine taking q 30 minutes 3;30 BP 128/94 (before release) 3/8 = 150/80 3/9 - BP 136/76, 13/70 3/10 - 136/76 3/11 - BP 140/74 3/12 - BP 120/78  Only slept about 8 hours from Sunday to Wednesday Took some Melatonin & it helped Sunday - started dipping again & started drinking coffee in the AM   AMD

## 2022-04-10 NOTE — Progress Notes (Signed)
04/11/2022 9:59 AM   Derrick [name] 1970-02-20 [id number]  Referring provider: Sable Feil, PA-C [address] [place],  [place] [address]  Urological history: 1.  Nephrolithiasis -Still composition of 50% calcium oxalate monohydrate, 30% calcium phosphate and 20% calcium oxalate dihydrate -left URS (2019)   2. High risk hematuria -No cigarette smoking, but marijuana use -CTU (2019) -nephrolithiasis -cysto (2019) - NED -no reports of gross heme -UA negative for micro heme  3.  Prostatitis -Inflammatory  4. Hematospermia  5. BPH  -PSA (02/2022) 1.2   Chief Complaint  Patient presents with   Hematuria    HPI: Derrick [name] is a 52 y.o. male who presents today for follow up KUB and UA.    At his visit on 03/28/2022, around first week in February he masturbated and when he ejaculated he noticed the same and look like it had fresh blood in it and then he had intercourse with his wife and noted the same phenomenon.  There is no pain with ejaculation or intercourse.  He also does not have any urinary symptoms.  He denies any pain or swelling in his testicles.  He has not had any episodes of perineal pain.  He has had very mild intermittent short lasting groin pain that mostly occurs when exerting himself.  UA yellow clear, specific gravity 1.010, pH 6.0, trace RBCs, 0-5 WBCs, 3-10 RBCs and 0-10 epithelial cells.  PVR 66 mL.   KUB faint calcification just below the level of L2 on the right.  Urine culture no growth.   He is no longer having any further hematospermia or groin pain.  Patient denies any modifying or aggravating factors.  Patient denies any gross hematuria, dysuria or suprapubic/flank pain.  Patient denies any fevers, chills, nausea or vomiting.    KUB non diagnostic   UA yellow clear, specific gravity 1.010, trace blood, pH 6.0, 0-5 WBCs, 0-2 RBCs and 0-10 epithelial cells.   PMH: Past Medical History:  Diagnosis Date   Anxiety    Depression     GERD (gastroesophageal reflux disease)    History of kidney stones    Renal disorder    kidney stones   Sleep apnea     Surgical History: Past Surgical History:  Procedure Laterality Date   CYSTOSCOPY/URETEROSCOPY/HOLMIUM LASER/STENT PLACEMENT Left 01/14/2018   Procedure: CYSTOSCOPY/URETEROSCOPY/HOLMIUM LASER/STENT PLACEMENT;  Surgeon: Hollice Espy, MD;  Location: [facility] ORS;  Service: Urology;  Laterality: Left;   VASECTOMY      Home Medications:  Allergies as of 04/11/2022   No Known Allergies      Medication List        Accurate as of April 11, 2022  9:59 AM. If you have any questions, ask your nurse or doctor.          STOP taking these medications    citalopram 20 MG tablet Commonly known as: CELEXA       TAKE these medications    famotidine 20 MG tablet Commonly known as: PEPCID Take 20 mg by mouth daily.   hydrocortisone 2.5 % rectal cream Commonly known as: ANUSOL-HC Apply topically.   hydrOXYzine 25 MG capsule Commonly known as: VISTARIL Take 2 capsules (50 mg total) by mouth every 8 (eight) hours as needed for anxiety.   psyllium 58.6 % powder Commonly known as: METAMUCIL Take 1 packet by mouth daily.   tamsulosin 0.4 MG Caps capsule Commonly known as: FLOMAX TAKE 1 CAPSULE(0.4 MG) BY MOUTH DAILY  Allergies: No Known Allergies  Family History: Family History  Problem Relation Age of Onset   Dementia Father    Renal cancer Neg Hx    Prostate cancer Neg Hx     Social History:  reports that he has never smoked. He has never been exposed to tobacco smoke. His smokeless tobacco use includes snuff. He reports current alcohol use. He reports that he does not currently use drugs after having used the following drugs: Marijuana.  ROS: Pertinent ROS in HPI  Physical Exam: BP (!) 151/88   Pulse 61   Ht 5\' 9"  (1.753 m)   Wt 195 lb (88.5 kg)   BMI 28.80 kg/m   Constitutional:  Well nourished. Alert and oriented, No acute  distress. HEENT: [place] AT, moist mucus membranes.  Trachea midline Cardiovascular: No clubbing, cyanosis, or edema. Respiratory: Normal respiratory effort, no increased work of breathing. Neurologic: Grossly intact, no focal deficits, moving all 4 extremities. Psychiatric: Normal mood and affect.   Laboratory Data: Lab Results  Component Value Date   WBC 6.0 04/03/2022   HGB 16.8 04/03/2022   HCT 48.0 04/03/2022   MCV 90.4 04/03/2022   PLT 226 04/03/2022    Lab Results  Component Value Date   CREATININE 0.88 04/03/2022  Urinalysis  See EPIC and HPI I have reviewed the labs.   Pertinent Imaging: Narrative & Impression  CLINICAL DATA:  Hematospermia, bilateral groin pain.   EXAM: ABDOMEN - 1 VIEW   COMPARISON:  Abdominal radiograph dated 03/28/2022.   FINDINGS: The bowel gas pattern is normal. A 2 mm calcification overlies the upper aspect of the left kidney, likely reflecting a renal calculus.   IMPRESSION: A 2 mm calcification overlies the upper aspect of the left kidney, likely reflecting a renal calculus.     Electronically Signed   By: Zerita Boers M.D.   On: 04/13/2022 21:51  I have reviewed the labs.   Assessment & Plan:    1. Hematospermia -resolved   2. Groin pain -resolved  -May have been secondary to passage of a stone or prostate inflammation  3.  Microscopic hematuria -UA negative for micro heme -We will recheck a random UA in 3 months to ensure the microscopic hematuria does not persist  4.  Nephrolithiasis -punctate stone of left kidney  -asymptomatic    Return in about 3 months (around 07/12/2022) for UA only .  These notes generated with voice recognition software. I apologize for typographical errors.  [place], [place] [address]  [place], [place] [address] [phone]

## 2022-04-11 ENCOUNTER — Ambulatory Visit
Admission: RE | Admit: 2022-04-11 | Discharge: 2022-04-11 | Disposition: A | Discharge status: Home / Self Care | Payer: 59 | Attending: Urology | Admitting: Urology

## 2022-04-11 ENCOUNTER — Ambulatory Visit (INDEPENDENT_AMBULATORY_CARE_PROVIDER_SITE_OTHER): Payer: 59 | Admitting: Urology

## 2022-04-11 ENCOUNTER — Ambulatory Visit: Payer: Self-pay | Admitting: Physician Assistant

## 2022-04-11 ENCOUNTER — Ambulatory Visit
Admission: RE | Admit: 2022-04-11 | Discharge: 2022-04-11 | Disposition: A | Discharge status: Home / Self Care | Payer: 59 | Source: Ambulatory Visit | Attending: Urology | Admitting: Urology

## 2022-04-11 VITALS — BP 151/88 | HR 61 | Ht 69.0 in | Wt 195.0 lb

## 2022-04-11 DIAGNOSIS — R3129 Other microscopic hematuria: Secondary | ICD-10-CM | POA: Diagnosis not present

## 2022-04-11 DIAGNOSIS — R109 Unspecified abdominal pain: Secondary | ICD-10-CM | POA: Diagnosis not present

## 2022-04-11 DIAGNOSIS — R1031 Right lower quadrant pain: Secondary | ICD-10-CM | POA: Insufficient documentation

## 2022-04-11 DIAGNOSIS — N200 Calculus of kidney: Secondary | ICD-10-CM

## 2022-04-11 DIAGNOSIS — R361 Hematospermia: Secondary | ICD-10-CM | POA: Diagnosis not present

## 2022-04-11 DIAGNOSIS — R1032 Left lower quadrant pain: Secondary | ICD-10-CM

## 2022-04-11 LAB — MICROSCOPIC EXAMINATION: Bacteria, UA: NONE SEEN

## 2022-04-11 LAB — URINALYSIS, COMPLETE
Bilirubin, UA: NEGATIVE
Glucose, UA: NEGATIVE
Ketones, UA: NEGATIVE
Leukocytes,UA: NEGATIVE
Nitrite, UA: NEGATIVE
Protein,UA: NEGATIVE
Specific Gravity, UA: 1.01 (ref 1.005–1.030)
Urobilinogen, Ur: 0.2 mg/dL (ref 0.2–1.0)
pH, UA: 6 (ref 5.0–7.5)

## 2022-04-14 ENCOUNTER — Encounter: Payer: Self-pay | Admitting: Urology

## 2022-04-19 DIAGNOSIS — Z86006 Personal history of melanoma in-situ: Secondary | ICD-10-CM | POA: Diagnosis not present

## 2022-04-19 DIAGNOSIS — D225 Melanocytic nevi of trunk: Secondary | ICD-10-CM | POA: Diagnosis not present

## 2022-04-19 DIAGNOSIS — D2261 Melanocytic nevi of right upper limb, including shoulder: Secondary | ICD-10-CM | POA: Diagnosis not present

## 2022-04-19 DIAGNOSIS — D2262 Melanocytic nevi of left upper limb, including shoulder: Secondary | ICD-10-CM | POA: Diagnosis not present

## 2022-04-19 DIAGNOSIS — L821 Other seborrheic keratosis: Secondary | ICD-10-CM | POA: Diagnosis not present

## 2022-04-19 DIAGNOSIS — Z85820 Personal history of malignant melanoma of skin: Secondary | ICD-10-CM | POA: Diagnosis not present

## 2022-04-22 ENCOUNTER — Ambulatory Visit: Payer: Self-pay | Admitting: Physician Assistant

## 2022-04-22 ENCOUNTER — Encounter: Payer: Self-pay | Admitting: Physician Assistant

## 2022-04-22 VITALS — BP 129/84 | HR 79 | Temp 98.0°F | Resp 12

## 2022-04-22 DIAGNOSIS — F4323 Adjustment disorder with mixed anxiety and depressed mood: Secondary | ICD-10-CM

## 2022-04-22 NOTE — Progress Notes (Signed)
   Subjective: Anxiety    Patient ID: Derrick [name], male    DOB: 04-29-1970, 52 y.o.   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  Patient was given prescription for Atarax to 50 mg as needed anxiety attack.  Patient states he is only had to use the medication once since last visit.  State medication did help him to deal with anxiety.  Patient states he knows anxiety levels increase due to lack of activities.  Patient is pending evaluation by psychiatry.  Patient and wife agree no strong anxiety medication needed at this time.   Review of Systems Anxiety    Objective:   Physical Exam No acute distress.  Patient appears more relaxed. BP is 129/84, pulse 79, respiration 12, temperature 98, patient 90% O2 sat on room air. Physical exam was deferred.       Assessment & Plan: Anxiety  Patient appears to be coping well with anxiety.  Advised to keep Atarax available for breakthrough anxiety.  Follow-up with scheduled psychiatry appointment.  Return to clinic as needed.

## 2022-04-22 NOTE — Progress Notes (Signed)
Took 1 dose Vistaril this past Saturday morning - woke up 4:00 am with mind racing.  Took about an hour to see any relief with med.  Anxiety is worse when not actively doing something - like this past week when it was raining.  Wife states she's heard Derrick [name] laughing more in the past couple weeks than in the past 6 months.  AMD

## 2022-05-30 ENCOUNTER — Ambulatory Visit (INDEPENDENT_AMBULATORY_CARE_PROVIDER_SITE_OTHER): Payer: 59 | Admitting: Psychiatry

## 2022-05-30 ENCOUNTER — Encounter: Payer: Self-pay | Admitting: Psychiatry

## 2022-05-30 VITALS — BP 120/69 | HR 61 | Temp 97.7°F | Ht 69.0 in | Wt 194.2 lb

## 2022-05-30 DIAGNOSIS — F411 Generalized anxiety disorder: Secondary | ICD-10-CM | POA: Diagnosis not present

## 2022-05-30 DIAGNOSIS — G4710 Hypersomnia, unspecified: Secondary | ICD-10-CM | POA: Diagnosis not present

## 2022-05-30 DIAGNOSIS — G4730 Sleep apnea, unspecified: Secondary | ICD-10-CM | POA: Insufficient documentation

## 2022-05-30 DIAGNOSIS — F439 Reaction to severe stress, unspecified: Secondary | ICD-10-CM

## 2022-05-30 DIAGNOSIS — F32A Depression, unspecified: Secondary | ICD-10-CM | POA: Diagnosis not present

## 2022-05-30 MED ORDER — NORTRIPTYLINE HCL 25 MG PO CAPS: 25.0000 mg | ORAL_CAPSULE | Freq: Every day | ORAL | 1 refills | Status: DC

## 2022-05-30 NOTE — Patient Instructions (Addendum)
[email or web address] / Ardeth Perfect. [facility] [address], [place], [place] [address]   [phone]  [email or web address] [address] LCSW [address] [place], [place] [address]   [phone]     [email or web address]  www.[email or web address]  [employer] [email or web address] [address] [place], [place] [address]  [phone]  [facility], [facility] [email or web address] [address], [place], [place] [address]  [phone]   [facility] - [phone]  [facility] - [phone]  [facility] - [phone] counseling [phone]  [facility] [phone]   [email or web address] this clinician can offer telehealth and has a sliding scale option  [email or web address] this group also offers sliding scale rates and is based out of [place]  Dr. Liborio Nixon with the [facility] specializes in divorce  [employer] [facility] has interns who offer sliding scale rates and some of the full time clinicians do, as well. You complete their contact form on their website and the referrals coordinator will help to get connected to someone     Nortriptyline Capsules What is this medication? NORTRIPTYLINE (nor TRIP ti leen) treats depression. It increases the amount of serotonin and norepinephrine in the brain, substances that help regulate mood. It belongs to a group of medications called tricyclic antidepressants (TCAs). This medicine may be used for other purposes; ask your health care provider or pharmacist if you have questions. COMMON BRAND NAME(S): Aventyl, Pamelor What should I tell my care team before I take this medication? They need to know if you have any of these conditions: Bipolar disorder Brugada syndrome Difficulty passing  urine Glaucoma Heart disease If you drink alcohol Liver disease Schizophrenia Seizures Suicidal thoughts, plans or attempt; a previous suicide attempt by you or a family member Thyroid disease An unusual or allergic reaction to nortriptyline, other tricyclic antidepressants, other medications, foods, dyes, or preservatives Pregnant or trying to get pregnant Breast-feeding How should I use this medication? Take this medication by mouth with a glass of water. Follow the directions on the prescription label. Take your doses at regular intervals. Do not take it more often than directed. Do not stop taking this medication suddenly except upon the advice of your care team. Stopping this medication too quickly may cause serious side effects or your condition may worsen. A special MedGuide will be given to you by the pharmacist with each prescription and refill. Be sure to read this information carefully each time. Talk to your care team about the use of this medication in children. Special care may be needed. Overdosage: If you think you have taken too much of this medicine contact a poison control center or emergency room at once. NOTE: This medicine is only for you. Do not share this medicine with others. What if I miss a dose? If you miss a dose, take it as soon as you can. If it is almost time for your next dose, take only that dose. Do not take double or extra doses. What may interact with this medication? Do not take this medication with any of the following: Cisapride Dronedarone Linezolid MAOIs, such as Marplan, Nardil, and Parnate Methylene blue (injected into a vein) Pimozide Thioridazine This medication may also interact with the following: Alcohol Antihistamines for allergy, cough, and cold Atropine Buspirone Certain medications for bladder problems, such as oxybutynin or tolterodine Certain medications for depression, such as amitriptyline, fluoxetine, sertraline Certain  medications for headaches, such as sumatriptan or rizatriptan Certain medications for Parkinson disease, such as benztropine or trihexyphenidyl Certain medications for stomach problems, such as dicyclomine or hyoscyamine Certain medications for travel sickness, such as scopolamine Chlorpropamide Cimetidine Fentanyl Ipratropium Lithium Other medications that cause heart rhythm changes, such as dofetilide Quinidine Reserpine St. John's wort Thyroid medication Tramadol Tryptophan This list may not describe all possible interactions. Give your health care provider a list of all the medicines, herbs, non-prescription drugs, or dietary supplements you use. Also tell them if you smoke, drink alcohol, or use illegal drugs. Some items may interact with your medicine. What should I watch for while using this medication? Tell your care team if your symptoms do not get better or if they get worse. Visit your care team for regular checks on your progress. Because it may take several weeks to see the full effects of this medication, it is important to continue your treatment as prescribed by your care team. Patients and their families should watch out for new or worsening thoughts of suicide or depression. Also watch out for sudden changes in feelings such as feeling anxious, agitated, panicky, irritable, hostile, aggressive, impulsive, severely restless, overly excited and hyperactive, or not being able to sleep. If this happens, especially at the beginning of treatment or after a change in dose, call your care team. You may get drowsy or dizzy. Do not drive, use machinery, or do anything that needs mental alertness until you know how this medication affects you. Do not stand or sit up quickly, especially if you are an older patient. This reduces the risk of dizzy or fainting spells. Alcohol may interfere with the effect of this medication. Avoid alcoholic drinks. Do not treat yourself for coughs, colds, or  allergies without asking your care team for advice. Some ingredients can increase possible side effects. Your mouth may get dry. Chewing sugarless gum or sucking hard candy, and drinking plenty of water may help. Contact your care team if the problem does not go away or is severe. This medication may cause dry eyes and blurred vision. If you wear contact lenses you may feel some discomfort. Lubricating drops may help. See your eye doctor if the problem does not go away or is severe. This medication can cause constipation. Try to have a bowel movement at least every 2 to 3 days. If you do not have a bowel movement for 3 days, call your care team. This medication can make you more sensitive to the sun. Keep out of the sun. If you cannot avoid being in the sun, wear protective clothing and use sunscreen. Do not use sun lamps or tanning beds/booths. What side effects may I notice from receiving this medication? Side effects that you should report to your care team as soon as possible: Allergic reactions--skin rash, itching, hives, swelling of the face, lips, tongue, or throat Heart rhythm changes--fast or irregular heartbeat, dizziness, feeling faint or lightheaded, chest pain, trouble breathing Irritability, confusion, fast or irregular heartbeat, muscle stiffness, twitching muscles, sweating, high fever, seizure, chills, vomiting, diarrhea, which may be signs of serotonin syndrome Seizures Sudden eye pain or change in vision such as blurry vision, seeing halos around lights, vision loss Thoughts of suicide or self-harm, worsening mood, feelings of depression Trouble passing urine Side effects that usually do not require medical attention (report to your care team if they continue or are bothersome): Change in sex drive or performance Constipation Dizziness Drowsiness Dry mouth Tremors or shaking This  list may not describe all possible side effects. Call your doctor for medical advice about side  effects. You may report side effects to FDA at 1-800-FDA-1088. Where should I keep my medication? Keep out of the reach of children. Store at room temperature between 15 and 30 degrees C (59 and 86 degrees F). Keep container tightly closed. Throw away any unused medication after the expiration date. NOTE: This sheet is a summary. It may not cover all possible information. If you have questions about this medicine, talk to your doctor, pharmacist, or health care provider.  2023 Elsevier/Gold Standard (2020-08-21 00:00:00)
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
## 2022-05-31 ENCOUNTER — Encounter: Payer: Self-pay | Admitting: Psychiatry

## 2022-07-08 ENCOUNTER — Telehealth: Payer: Self-pay

## 2022-07-08 ENCOUNTER — Other Ambulatory Visit: Payer: 59

## 2022-07-08 DIAGNOSIS — R361 Hematospermia: Secondary | ICD-10-CM

## 2022-07-08 DIAGNOSIS — R1031 Right lower quadrant pain: Secondary | ICD-10-CM

## 2022-07-08 DIAGNOSIS — R1032 Left lower quadrant pain: Secondary | ICD-10-CM | POA: Diagnosis not present

## 2022-07-08 LAB — MICROSCOPIC EXAMINATION
Bacteria, UA: NONE SEEN
Epithelial Cells (non renal): NONE SEEN /hpf (ref 0–10)

## 2022-07-08 LAB — URINALYSIS, COMPLETE
Bilirubin, UA: NEGATIVE
Glucose, UA: NEGATIVE
Ketones, UA: NEGATIVE
Leukocytes,UA: NEGATIVE
Nitrite, UA: NEGATIVE
Protein,UA: NEGATIVE
Specific Gravity, UA: <1.005 — ABNORMAL LOW (ref 1.005–1.030)
Urobilinogen, Ur: 0.2 mg/dL (ref 0.2–1.0)
pH, UA: 5.5 (ref 5.0–7.5)

## 2022-07-08 NOTE — Telephone Encounter (Signed)
Spoke to patient and requested that he bring SD card to 07/09/2022. He does not have record of previous sleep study. He feels that sleep study was >10y. Nothing further needed.

## 2022-07-09 ENCOUNTER — Encounter: Payer: Self-pay | Admitting: Internal Medicine

## 2022-07-09 ENCOUNTER — Ambulatory Visit (INDEPENDENT_AMBULATORY_CARE_PROVIDER_SITE_OTHER): Payer: 59 | Admitting: Internal Medicine

## 2022-07-09 VITALS — BP 118/84 | HR 67 | Temp 97.3°F | Ht 69.0 in | Wt 195.8 lb

## 2022-07-09 DIAGNOSIS — G4733 Obstructive sleep apnea (adult) (pediatric): Secondary | ICD-10-CM | POA: Diagnosis not present

## 2022-07-09 NOTE — Patient Instructions (Signed)
Recommend obtaining home sleep study to assess for sleep apnea Recommend weight loss

## 2022-07-09 NOTE — Progress Notes (Signed)
Name: Derrick [name] MRN: [id number] DOB: 08-Apr-1970    CHIEF COMPLAINT:  EXCESSIVE DAYTIME SLEEPINESS Patient with previous diagnosis of OSA previous sleep study not available   HISTORY OF PRESENT ILLNESS: Patient is seen today for problems and issues with sleep related to excessive daytime sleepiness Patient  has been having sleep problems for many years Patient has been having excessive daytime sleepiness for a long time Patient has been having extreme fatigue and tiredness, lack of energy +  very Loud snoring every night + struggling breathe at night and gasps for air + morning headaches + Nonrefreshing sleep  Patient was diagnosed with sleep apnea 8 to 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  Encouraged proper weight management.  Discussed driving precautions and its relationship with hypersomnolence.  Discussed operating dangerous equipment and its relationship with hypersomnolence.  Discussed sleep hygiene, and benefits of a fixed sleep waked time.  The importance of getting eight or more hours of sleep discussed with patient.  Discussed limiting the use of the computer and television before bedtime.  Decrease naps during the day, so night time sleep will become enhanced.  Limit caffeine, and sleep deprivation.  HTN, stroke, and heart failure are potential risk factors.  Previous CPAP download reviewed with patient Incomplete data however patient does have CPAP of 8 cm water pressure I have explained that maybe auto CPAP may be an option for him    EPWORTH SLEEP SCORE 13  No exacerbation at this time No evidence of heart failure at this time No evidence or signs of infection at this time No respiratory distress No fevers, chills, nausea, vomiting, diarrhea No evidence of lower extremity edema No evidence  hemoptysis   PAST MEDICAL HISTORY :   has a past medical history of Anxiety, Depression, GERD (gastroesophageal reflux disease), History of kidney stones, Renal disorder, and Sleep apnea.  has a past surgical history that includes Vasectomy and Cystoscopy/ureteroscopy/holmium laser/stent placement (Left, 01/14/2018). Prior to Admission medications   Medication Sig Start Date End Date Taking? Authorizing Provider  famotidine (PEPCID) 20 MG tablet Take 20 mg by mouth daily.   Yes [provider]  nortriptyline (PAMELOR) 25 MG capsule Take 1 capsule (25 mg total) by mouth at bedtime. 05/30/22  Yes Jomarie Longs, MD  tamsulosin (FLOMAX) 0.4 MG CAPS capsule TAKE 1 CAPSULE(0.4 MG) BY MOUTH DAILY 11/28/21  Yes Vanna Scotland, MD  hydrocortisone (ANUSOL-HC) 2.5 % rectal cream Apply topically. Patient not taking: Reported on 07/09/2022 02/11/22   [provider]  hydrOXYzine (VISTARIL) 25 MG capsule Take 2 capsules (50 mg total) by mouth every 8 (eight) hours as needed for anxiety. Patient not taking: Reported on 07/09/2022 04/10/22   Joni Reining, PA-C  psyllium (METAMUCIL) 58.6 % powder Take 1 packet by mouth daily. Patient not taking: Reported on 07/09/2022    [provider]   No Known Allergies  FAMILY HISTORY:  family history includes Dementia in his father, paternal aunt, and paternal grandmother; Parkinsonism in his maternal grandfather. SOCIAL HISTORY:  reports that he has never smoked. He has never been exposed to tobacco smoke. His smokeless tobacco use includes snuff. He reports current alcohol use. He reports that he does not currently use drugs after having used the following drugs: Marijuana.   Review of Systems:  Gen:  Denies  fever, sweats, chills weight loss  HEENT: Denies blurred vision, double vision, ear pain, eye pain, hearing loss, nose bleeds, sore throat Cardiac:  No dizziness, chest pain or heaviness, chest tightness,edema, No JVD Resp:   No  cough, -sputum production, -shortness of breath,-wheezing, -hemoptysis,  Gi: Denies swallowing difficulty, stomach pain, nausea or vomiting, diarrhea, constipation, bowel incontinence Gu:  Denies bladder incontinence, burning urine Ext:   Denies Joint pain, stiffness or swelling Skin: Denies  skin rash, easy bruising or bleeding or hives Endoc:  Denies polyuria, polydipsia , polyphagia or weight change Psych:   Denies depression, insomnia or hallucinations  Other:  All other systems negative   ALL OTHER ROS ARE NEGATIVE   BP 118/84 (BP Location: Left Arm, Cuff Size: Normal)   Pulse 67   Temp (!) 97.3 F (36.3 C)   Ht 5\' 9"  (1.753 m)   Wt 195 lb 12.8 oz (88.8 kg)   SpO2 100%   BMI 28.91 kg/m     Physical Examination:   General Appearance: No distress  EYES PERRLA, EOM intact.   NECK Supple, No JVD Pulmonary: normal breath sounds, No wheezing.  CardiovascularNormal S1,S2.  No m/r/g.   Abdomen: Benign, Soft, non-tender. Skin:   warm, no rashes, no ecchymosis  Extremities: normal, no cyanosis, clubbing. Neuro:without focal findings,  speech normal  PSYCHIATRIC: Mood, affect within normal limits.   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 All questions answered  Follow up  3 months  Total Time  Spent  35 mins   Wallis Bamberg Santiago Glad, M.D.  [facility]  Medical Director [facility] Medical Director [facility] Cardio-Pulmonary Department

## 2022-07-11 ENCOUNTER — Telehealth: Payer: Self-pay | Admitting: Psychiatry

## 2022-07-11 DIAGNOSIS — F411 Generalized anxiety disorder: Secondary | ICD-10-CM

## 2022-07-11 DIAGNOSIS — F439 Reaction to severe stress, unspecified: Secondary | ICD-10-CM

## 2022-07-11 MED ORDER — NORTRIPTYLINE HCL 25 MG PO CAPS: 25.0000 mg | ORAL_CAPSULE | Freq: Every day | ORAL | 0 refills | Status: DC

## 2022-07-11 NOTE — Telephone Encounter (Signed)
Called patient to reschedule appointment due to provider out of office. Stated that he has no refills left on the Nortriptyline. Will a refill be available prior to July 4. Please advise.

## 2022-07-11 NOTE — Telephone Encounter (Signed)
I have send nortriptyline 25 mg to pharmacy at [facility] with instructions to ' fill when due".  Please let patient know.

## 2022-07-13 ENCOUNTER — Other Ambulatory Visit: Payer: Self-pay | Admitting: Urology

## 2022-07-13 DIAGNOSIS — R3129 Other microscopic hematuria: Secondary | ICD-10-CM

## 2022-07-15 ENCOUNTER — Other Ambulatory Visit: Payer: 59

## 2022-07-15 NOTE — Telephone Encounter (Signed)
Patient advised.

## 2022-07-15 NOTE — Telephone Encounter (Signed)
Patient states he is taking this medication and it does help with urinary frequency and urgency and been able to empty bladder well. He would like to stay on this medication. Ok to refill/

## 2022-07-21 DIAGNOSIS — G4730 Sleep apnea, unspecified: Secondary | ICD-10-CM | POA: Diagnosis not present

## 2022-07-23 ENCOUNTER — Ambulatory Visit: Payer: 59 | Admitting: Psychiatry

## 2022-07-27 ENCOUNTER — Telehealth: Payer: Self-pay | Admitting: Urology

## 2022-08-07 ENCOUNTER — Telehealth: Payer: Self-pay | Admitting: Pulmonary Disease

## 2022-08-07 ENCOUNTER — Encounter (INDEPENDENT_AMBULATORY_CARE_PROVIDER_SITE_OTHER): Payer: 59

## 2022-08-07 DIAGNOSIS — G4733 Obstructive sleep apnea (adult) (pediatric): Secondary | ICD-10-CM | POA: Diagnosis not present

## 2022-08-07 NOTE — Telephone Encounter (Signed)
Pt calling back for HST results,

## 2022-08-07 NOTE — Telephone Encounter (Signed)
I have notified the patient and placed the order for a CPAP machine.  Nothing further needed.

## 2022-08-07 NOTE — Telephone Encounter (Signed)
Per Dr. Belia Heman, Wallis Bamberg, HST shows DIAGNOSIS OF  OBSTRUCTIVE SLEEP APNEA. OPTIONS I recommend a trial of auto CPAP 5 to 15 cm H2O with heated humidity and mask of choice

## 2022-08-07 NOTE — Telephone Encounter (Signed)
I have left a message for the patient to return my call.  

## 2022-08-07 NOTE — Telephone Encounter (Signed)
Call patient  Sleep study result  Date of study: 07/20/2022, 07/21/2022  Impression:  Moderate obstructive sleep apnea with significant oxygen desaturations -Study on 07/20/2022 showed  moderate obstructive sleep apnea, 1 on 623 showed mild obstructive sleep apnea  Recommendation:  DME referral  Recommend CPAP therapy for moderate obstructive sleep apnea  Auto titrating CPAP with pressure settings of 5-15 will be appropriate  Encourage weight loss measures  Follow-up in the office 4 to 6 weeks following initiation of treatment

## 2022-08-09 ENCOUNTER — Ambulatory Visit: Payer: 59 | Admitting: Cardiovascular Disease

## 2022-08-22 ENCOUNTER — Other Ambulatory Visit: Payer: Self-pay | Admitting: Psychiatry

## 2022-08-22 DIAGNOSIS — F439 Reaction to severe stress, unspecified: Secondary | ICD-10-CM

## 2022-08-22 DIAGNOSIS — F411 Generalized anxiety disorder: Secondary | ICD-10-CM

## 2022-09-01 ENCOUNTER — Other Ambulatory Visit: Payer: Self-pay | Admitting: Psychiatry

## 2022-09-01 DIAGNOSIS — F411 Generalized anxiety disorder: Secondary | ICD-10-CM

## 2022-09-01 DIAGNOSIS — F439 Reaction to severe stress, unspecified: Secondary | ICD-10-CM

## 2022-09-02 ENCOUNTER — Other Ambulatory Visit (HOSPITAL_COMMUNITY): Payer: Self-pay | Admitting: Psychiatry

## 2022-09-02 ENCOUNTER — Telehealth: Payer: Self-pay | Admitting: Psychiatry

## 2022-09-02 DIAGNOSIS — F439 Reaction to severe stress, unspecified: Secondary | ICD-10-CM

## 2022-09-02 DIAGNOSIS — F411 Generalized anxiety disorder: Secondary | ICD-10-CM

## 2022-09-02 DIAGNOSIS — G4733 Obstructive sleep apnea (adult) (pediatric): Secondary | ICD-10-CM | POA: Diagnosis not present

## 2022-09-02 MED ORDER — NORTRIPTYLINE HCL 25 MG PO CAPS: 25.0000 mg | ORAL_CAPSULE | Freq: Every day | ORAL | 1 refills | Status: DC

## 2022-09-02 NOTE — Telephone Encounter (Signed)
Patient called stating the pharmacy stated his refill was denied  and not sure if by doctor or why. Our office cancelled his June appt and rescheduled him to 09/23/22 He needs refill on  nortriptyline 25 mg. He is out. Send to [facility] in [place].

## 2022-09-02 NOTE — Telephone Encounter (Signed)
sent 

## 2022-09-03 NOTE — Telephone Encounter (Signed)
Pt.notified

## 2022-09-05 NOTE — Telephone Encounter (Signed)
Error

## 2022-09-23 ENCOUNTER — Encounter: Payer: Self-pay | Admitting: Physician Assistant

## 2022-09-23 ENCOUNTER — Ambulatory Visit (INDEPENDENT_AMBULATORY_CARE_PROVIDER_SITE_OTHER): Payer: 59 | Admitting: Psychiatry

## 2022-09-23 ENCOUNTER — Encounter: Payer: Self-pay | Admitting: Psychiatry

## 2022-09-23 ENCOUNTER — Ambulatory Visit: Payer: Self-pay | Admitting: Physician Assistant

## 2022-09-23 VITALS — BP 143/75 | HR 69 | Temp 98.0°F | Ht 69.0 in | Wt 199.4 lb

## 2022-09-23 DIAGNOSIS — F411 Generalized anxiety disorder: Secondary | ICD-10-CM | POA: Diagnosis not present

## 2022-09-23 DIAGNOSIS — F439 Reaction to severe stress, unspecified: Secondary | ICD-10-CM | POA: Diagnosis not present

## 2022-09-23 DIAGNOSIS — F330 Major depressive disorder, recurrent, mild: Secondary | ICD-10-CM | POA: Diagnosis not present

## 2022-09-23 DIAGNOSIS — H6122 Impacted cerumen, left ear: Secondary | ICD-10-CM

## 2022-09-23 MED ORDER — NORTRIPTYLINE HCL 25 MG PO CAPS: 50.0000 mg | ORAL_CAPSULE | Freq: Every day | ORAL | 2 refills | Status: DC

## 2022-09-23 NOTE — Progress Notes (Signed)
BH MD OP Progress Note  09/23/2022 9:03 AM Derrick [name]  MRN:  [id number]  Chief Complaint:  Chief Complaint  Patient presents with   Follow-up   Depression   Anxiety   Medication Refill   HPI: Derrick [name] is a 52 year old Caucasian male, married, retired Tax inspector, currently employed part-time, lives in [place], has a history of GAD, MDD, trauma and stress related symptoms prostatitis, hematospermia, BPH, nephrolithiasis, was evaluated in office today presented for a follow-up appointment.  Patient today reports he has noticed good improvement on the nortriptyline.  His depression symptoms like sadness, anhedonia has improved.  Patient also reports anxiety symptoms like worrying, feeling nervous has improved on the medication.  He does continue to have situations that trigger his anxiety.  He also has jaw clenching, this has been going on even before he started the medication.  The medication may have helped with it to some extent.  He reports he mostly has it when he sits down after his dinner is about to go to sleep.  He notices it more time.  He likely is doing it in his sleep also.  He however is not aware of it.  Patient reports he had sleep study done and is currently on a new CPAP.  He is using it regularly.  Reports sleep is better.  He wakes up feeling rested.  Patient reports he has not been able to find a therapist yet however is motivated to do so.  Patient denies any suicidality, homicidality or perceptual disturbances.  Patient denies any other concerns today.  Visit Diagnosis:    ICD-10-CM   1. GAD (generalized anxiety disorder)  F41.1 nortriptyline (PAMELOR) 25 MG capsule    2. MDD (major depressive disorder), recurrent episode, mild (HCC)  F33.0     3. Trauma and stressor-related disorder  F43.9 nortriptyline (PAMELOR) 25 MG capsule   unspecified , R/O PTSD      Past Psychiatric History: I have reviewed past psychiatric history from progress  note on 05/30/2022.  Medications like Celexa, Paxil, Wellbutrin, clonazepam.  Past Medical History:  Past Medical History:  Diagnosis Date   Anxiety    Depression    GERD (gastroesophageal reflux disease)    History of kidney stones    Renal disorder    kidney stones   Sleep apnea     Past Surgical History:  Procedure Laterality Date   CYSTOSCOPY/URETEROSCOPY/HOLMIUM LASER/STENT PLACEMENT Left 01/14/2018   Procedure: CYSTOSCOPY/URETEROSCOPY/HOLMIUM LASER/STENT PLACEMENT;  Surgeon: Vanna Scotland, MD;  Location: [facility] ORS;  Service: Urology;  Laterality: Left;   VASECTOMY      Family Psychiatric History: Reviewed family psychiatric history from progress note on 05/30/2022.  Family History:  Family History  Problem Relation Age of Onset   Dementia Father    Dementia Paternal Aunt    Parkinsonism Maternal Grandfather    Dementia Paternal Grandmother    Renal cancer Neg Hx    Prostate cancer Neg Hx     Social History: Reviewed social history from progress note on 05/30/2022. 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 [place]   Occupation: Emergency management    Employer: [employer]    Comment: Part Time  Tobacco Use   Smoking status: Never    Passive exposure: Never   Smokeless tobacco: Current    Types: Snuff  Vaping Use   Vaping status: Never Used  Substance and Sexual Activity   Alcohol use: Yes    Comment: occasionally   Drug use: Not Currently    Types: Marijuana   Sexual activity: Yes    Birth control/protection: None  Other Topics Concern   Not on file  Social History Narrative   Not on file   Social Determinants of Health   Financial Resource Strain: Not on file  Food  Insecurity: Not on file  Transportation Needs: Not on file  Physical Activity: Not on file  Stress: Not on file  Social Connections: Not on file    Allergies: No Known Allergies  Metabolic Disorder Labs: No results found for: "HGBA1C", "MPG" No results found for: "PROLACTIN" Lab Results  Component Value Date   CHOL 151 03/18/2022   TRIG 46 03/18/2022   HDL 53 03/18/2022   CHOLHDL 2.8 03/18/2022   LDLCALC 88 03/18/2022   LDLCALC 95 03/05/2021   Lab Results  Component Value Date   TSH 0.668 03/18/2022   TSH 0.908 03/05/2021    Therapeutic Level Labs: No results found for: "LITHIUM" No results found for: "VALPROATE" No results found for: "CBMZ"  Current Medications: Current Outpatient Medications  Medication Sig Dispense Refill   famotidine (PEPCID) 20 MG tablet Take 20 mg by mouth daily.     hydrocortisone (ANUSOL-HC) 2.5 % rectal cream Apply topically.     hydrOXYzine (VISTARIL) 25 MG capsule Take 2 capsules (50 mg total) by mouth every 8 (eight) hours as needed for anxiety. 30 capsule 0   psyllium (METAMUCIL) 58.6 % powder Take 1 packet by mouth daily.     tamsulosin (FLOMAX) 0.4 MG CAPS capsule TAKE 1 CAPSULE(0.4 MG) BY MOUTH DAILY 90 capsule 1   nortriptyline (PAMELOR) 25 MG capsule Take 2 capsules (50 mg total) by mouth at bedtime. 60 capsule 2   No current facility-administered medications for this visit.     Musculoskeletal: Strength & Muscle Tone: within normal limits Gait & Station: normal Patient leans: N/A  Psychiatric Specialty Exam: Review of Systems  HENT:         Jaw clenching  Psychiatric/Behavioral:  Positive for dysphoric mood. The patient is nervous/anxious.     Blood pressure (!) 143/75, pulse 69, temperature 98 F (36.7 C), temperature source Skin, height 5\' 9"  (1.753 m), weight 199 lb 6.4 oz (90.4 kg).Body mass index is 29.45 kg/m.  General Appearance: Fairly Groomed  Eye Contact:  Fair  Speech:  Clear and Coherent  Volume:  Normal   Mood:  Anxious and Depressed improving  Affect:  Appropriate  Thought Process:  Goal Directed and Descriptions of Associations: Intact  Orientation:  Full (Time, Place, and Person)  Thought Content: Logical   Suicidal Thoughts:  No  Homicidal Thoughts:  No  Memory:  Immediate;   Fair Recent;   Fair Remote;   Fair  Judgement:  Fair  Insight:  Fair  Psychomotor Activity:  Normal  Concentration:  Concentration: Fair and Attention Span: Fair  Recall:  [employer] of Knowledge: Fair  Language: Fair  Akathisia:  No  Handed:  Right  AIMS (if indicated): not done  Assets:  Communication Skills Desire for Improvement Housing Social Support  ADL's:  Intact  Cognition: WNL  Sleep:  Fair   Screenings: GAD-7    Garment/textile technologist Visit  from 09/23/2022 in [facility] Office Visit from 05/30/2022 in [facility]  Total GAD-7 Score 6 10      PHQ2-9    Flowsheet Row Office Visit from 09/23/2022 in [facility] Office Visit from 05/30/2022 in [facility]  PHQ-2 Total Score 2 3  PHQ-9 Total Score 3 11      Flowsheet Row Office Visit from 09/23/2022 in [facility] Office Visit from 05/30/2022 in [facility] ED from 04/03/2022 in [facility] Department at [facility]  C-SSRS RISK CATEGORY No Risk No Risk No Risk        Assessment and Plan: TREMAINE DWORACZYK is a 52 year old Caucasian male, retired, IT sales professional, currently works part-time, lives in [place], has a history of GAD, MDD, multiple medical problems, with progress on the nortriptyline although may benefit from trial of dosage increase to target residual symptoms and will also benefit from CBT.  Plan as noted below.  Plan GAD-improving Increase nortriptyline to 50 mg p.o. daily at  bedtime Patient referred for CBT-patient also provided information for Ms. Phyllis Ginger at [phone].  Patient to reach out to establish care.  MDD-unstable Nortriptyline 50 mg at bedtime Hydroxyzine 25 mg p.o. twice daily as needed Patient advised to establish care.  Trauma and stress related disorder-rule out PTSD-improving Patient referred for CBT   Follow-up in clinic in 2 months or sooner if needed.  Collaboration of Care: Collaboration of Care: Referral or follow-up with counselor/therapist AEB patient encouraged to establish care with therapist.  Patient/Guardian was advised Release of Information must be obtained prior to any record release in order to collaborate their care with an outside provider. Patient/Guardian was advised if they have not already done so to contact the registration department to sign all necessary forms in order for [place] to release information regarding their care.   Consent: Patient/Guardian gives verbal consent for treatment and assignment of benefits for services provided during this visit. Patient/Guardian expressed understanding and agreed to proceed.   This note was generated in part or whole with voice recognition software. Voice recognition is usually quite accurate but there are transcription errors that can and very often do occur. I apologize for any typographical errors that were not detected and corrected.    Derrick Longs, MD 09/23/2022, 9:03 AM

## 2022-09-23 NOTE — Progress Notes (Signed)
   Subjective: Left ear pain with cerumen impaction    Patient ID: Derrick [name], male    DOB: [birth year], 52 y.o.   MRN: [id number]  HPI Patient complains of 2 days of left ear pain.  Denies hearing loss or vertigo.   Review of Systems Depression sleep apnea    Objective:   Physical Exam  Vital signs not taken. HEENT is remarkable for cerebral impaction left ear.  Right ear is clear.      Assessment & Plan: Ear pain secondary to cerumen impaction   Advise earwax softener for 2 to 3 days.  Return back in 2 to 3 days for irrigation.

## 2022-09-23 NOTE — Patient Instructions (Signed)
Phyllis Ginger - Therapist - please call [phone]

## 2022-09-26 ENCOUNTER — Ambulatory Visit: Payer: Self-pay | Admitting: Physician Assistant

## 2022-09-26 ENCOUNTER — Encounter: Payer: Self-pay | Admitting: Physician Assistant

## 2022-09-26 DIAGNOSIS — H6122 Impacted cerumen, left ear: Secondary | ICD-10-CM

## 2022-09-26 NOTE — Progress Notes (Signed)
Left ear canal irrigated, no longer impacted. Pt tolerated well and advised to use ear wax sofner as needed for future reference. Gretel Acre

## 2022-09-26 NOTE — Progress Notes (Signed)
   Subjective: Cerumen impaction    Patient ID: Derrick [name], male    DOB: 1970/09/05, 52 y.o.   MRN: [id number]  HPI Patient presents today for left irrigation secondary to cerumen impaction.   Review of Systems Anxiety and depression    Objective:   Physical Exam Left ear canal with cerumen impaction.  Right canal clear.       Assessment & Plan: Cerumen impaction   Left ear canal irrigated results of the removal of cerumen impaction.  Patient advised to use earwax softener as directed.  Follow-up as necessary.

## 2022-10-03 DIAGNOSIS — G4733 Obstructive sleep apnea (adult) (pediatric): Secondary | ICD-10-CM | POA: Diagnosis not present

## 2022-10-17 ENCOUNTER — Telehealth: Payer: Self-pay

## 2022-10-17 NOTE — Telephone Encounter (Signed)
pt left message that he tried the medication increase but he could not do so he went back on the previous dosage

## 2022-10-18 NOTE — Telephone Encounter (Signed)
NOTED

## 2022-10-18 NOTE — Telephone Encounter (Signed)
pt notified that dr Elna Breslow is aware that he went back on previous dosage.

## 2022-10-22 DIAGNOSIS — Z85820 Personal history of malignant melanoma of skin: Secondary | ICD-10-CM | POA: Diagnosis not present

## 2022-10-22 DIAGNOSIS — Z08 Encounter for follow-up examination after completed treatment for malignant neoplasm: Secondary | ICD-10-CM | POA: Diagnosis not present

## 2022-10-22 DIAGNOSIS — Z86006 Personal history of melanoma in-situ: Secondary | ICD-10-CM | POA: Diagnosis not present

## 2022-10-22 DIAGNOSIS — D2272 Melanocytic nevi of left lower limb, including hip: Secondary | ICD-10-CM | POA: Diagnosis not present

## 2022-10-22 DIAGNOSIS — D225 Melanocytic nevi of trunk: Secondary | ICD-10-CM | POA: Diagnosis not present

## 2022-10-22 DIAGNOSIS — D2271 Melanocytic nevi of right lower limb, including hip: Secondary | ICD-10-CM | POA: Diagnosis not present

## 2022-10-22 DIAGNOSIS — L821 Other seborrheic keratosis: Secondary | ICD-10-CM | POA: Diagnosis not present

## 2022-10-22 DIAGNOSIS — D2262 Melanocytic nevi of left upper limb, including shoulder: Secondary | ICD-10-CM | POA: Diagnosis not present

## 2022-10-22 DIAGNOSIS — D2261 Melanocytic nevi of right upper limb, including shoulder: Secondary | ICD-10-CM | POA: Diagnosis not present

## 2022-10-30 ENCOUNTER — Encounter: Payer: Self-pay | Admitting: Internal Medicine

## 2022-10-30 ENCOUNTER — Ambulatory Visit (INDEPENDENT_AMBULATORY_CARE_PROVIDER_SITE_OTHER): Payer: 59 | Admitting: Internal Medicine

## 2022-10-30 VITALS — BP 124/76 | HR 58 | Temp 98.4°F | Ht 69.0 in | Wt 200.8 lb

## 2022-10-30 DIAGNOSIS — G4733 Obstructive sleep apnea (adult) (pediatric): Secondary | ICD-10-CM

## 2022-10-30 NOTE — Progress Notes (Signed)
Name: Derrick [name] MRN: [id number] DOB: February 17, 1970    CHIEF COMPLAINT:  Follow up assessment of excessive daytime sleepiness  TESTS 07/2022 MODERATE OSA AHI 30  HISTORY OF PRESENT ILLNESS:  No exacerbation at this time No evidence of heart failure at this time No evidence or signs of infection at this time No respiratory distress No fevers, chills, nausea, vomiting, diarrhea No evidence of lower extremity edema No evidence hemoptysis   Encouraged proper weight management.  Important to get eight or more hours of sleep  Limiting the use of the computer and television before bedtime.  Decrease naps during the day, so night time sleep will become enhanced.  Limit caffeine, and sleep deprivation.  HTN, stroke, uncontrolled diabetes and heart failure are potential risk factors.  Risk of untreated sleep apnea including cardiac arrhthymias, stroke, DM, pulm HTN.    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  CPAP download September 2024 reviewed in detail with patient AHI significantly reduced down to 5 Auto CPAP 5-15 No significant leaks Excellent compliance  PAST MEDICAL HISTORY :   has a past medical history of Anxiety, Depression, GERD (gastroesophageal reflux disease), History of kidney stones, Renal disorder, and Sleep apnea.  has a past surgical history that includes Vasectomy and Cystoscopy/ureteroscopy/holmium laser/stent placement (Left, 01/14/2018). Prior to Admission medications   Medication Sig Start Date End Date Taking? Authorizing Provider  famotidine (PEPCID) 20 MG tablet Take 20 mg by mouth daily.   Yes [provider]  nortriptyline (PAMELOR) 25 MG capsule Take 1 capsule (25 mg total) by mouth at bedtime. 05/30/22  Yes Jomarie Longs, MD  tamsulosin (FLOMAX) 0.4 MG CAPS capsule TAKE 1 CAPSULE(0.4 MG) BY MOUTH DAILY 11/28/21  Yes Vanna Scotland, MD  hydrocortisone (ANUSOL-HC) 2.5 % rectal cream Apply topically. Patient not taking: Reported on 07/09/2022 02/11/22   [provider]  hydrOXYzine (VISTARIL) 25 MG capsule Take 2 capsules (50 mg total) by mouth every 8 (eight) hours as needed for anxiety. Patient not taking: Reported on 07/09/2022 04/10/22   Joni Reining, PA-C  psyllium (METAMUCIL) 58.6 % powder Take 1 packet by mouth daily. Patient not taking: Reported on 07/09/2022    [provider]   No Known Allergies  FAMILY HISTORY:  family history includes Dementia in his father, paternal aunt, and paternal grandmother; Parkinsonism in his maternal grandfather. SOCIAL HISTORY:  reports that he has never smoked. He has never been exposed to tobacco smoke. His smokeless tobacco use includes snuff. He reports current alcohol use. He reports that he does not currently use drugs after having used the following drugs: Marijuana.   .BP 124/76 (BP Location: Right Arm, Cuff Size: Normal)   Pulse (!) 58   Temp 98.4 F (36.9 C) (Oral)   Ht 5\' 9"  (1.753 m)   Wt 200 lb 12.8 oz (91.1 kg)   SpO2 98%   BMI 29.65 kg/m     Review of Systems: Gen:  Denies  fever, sweats, chills weight loss  HEENT: Denies blurred vision, double vision, ear pain, eye pain, hearing loss, nose bleeds, sore throat Cardiac:  No dizziness, chest pain or heaviness, chest tightness,edema, No JVD Resp:   No cough, -sputum production, -shortness of breath,-wheezing, -hemoptysis,  Other:  All other systems negative  Physical Examination:   General Appearance: No distress  EYES PERRLA, EOM intact.   NECK Supple, No JVD Pulmonary: normal breath sounds, No wheezing.  CardiovascularNormal S1,S2.  No m/r/g.   Abdomen: Benign, Soft, non-tender. 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 Patient recently retired and wants to restart his CPAP therapy, patient has also underlying depression and anxiety and was referred to [place] by the psychiatrist Patient with dx of MODERATE OSA AHI 30  MODERATE OSA Excellent compliance with CPAP AHI reduced to 5 Fullface mask at auto CPAP 5-15 Compliance report reviewed in detail with patient   Obesity -recommend significant weight loss -recommend changing diet  Deconditioned state -Recommend increased daily activity and exercise  Family history of sarcoid Chest x-ray reviewed in detail with patient from March 2024 No significant findings no significant adenopathy  MEDICATION ADJUSTMENTS/LABS AND TESTS ORDERED: Continue CPAP as prescribed  CURRENT MEDICATIONS REVIEWED AT LENGTH WITH PATIENT TODAY   Patient  satisfied with Plan of action and management. All questions answered  Follow up  6 months  Total Time Spent  34 mins   Lucie Leather, M.D.  [facility]  Medical Director [facility] Medical Director [facility] Cardio-Pulmonary Department

## 2022-10-30 NOTE — Patient Instructions (Signed)
Excellent Job! A+ keep up the great work Recommend weight loss

## 2022-11-02 DIAGNOSIS — G4733 Obstructive sleep apnea (adult) (pediatric): Secondary | ICD-10-CM | POA: Diagnosis not present

## 2022-11-12 ENCOUNTER — Ambulatory Visit: Payer: 59 | Admitting: Behavioral Health

## 2022-11-12 ENCOUNTER — Encounter: Payer: Self-pay | Admitting: Behavioral Health

## 2022-11-12 DIAGNOSIS — F331 Major depressive disorder, recurrent, moderate: Secondary | ICD-10-CM | POA: Diagnosis not present

## 2022-11-12 DIAGNOSIS — F411 Generalized anxiety disorder: Secondary | ICD-10-CM

## 2022-11-12 NOTE — Progress Notes (Unsigned)
French Ana, [facility]
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
## 2022-11-18 ENCOUNTER — Ambulatory Visit: Payer: Self-pay

## 2022-11-18 DIAGNOSIS — Z23 Encounter for immunization: Secondary | ICD-10-CM

## 2022-11-25 ENCOUNTER — Ambulatory Visit (INDEPENDENT_AMBULATORY_CARE_PROVIDER_SITE_OTHER): Payer: 59 | Admitting: Psychiatry

## 2022-11-25 ENCOUNTER — Encounter: Payer: Self-pay | Admitting: Psychiatry

## 2022-11-25 VITALS — BP 124/76 | HR 69 | Temp 98.0°F | Ht 69.0 in | Wt 199.8 lb

## 2022-11-25 DIAGNOSIS — F3342 Major depressive disorder, recurrent, in full remission: Secondary | ICD-10-CM

## 2022-11-25 DIAGNOSIS — F411 Generalized anxiety disorder: Secondary | ICD-10-CM | POA: Diagnosis not present

## 2022-11-25 DIAGNOSIS — F439 Reaction to severe stress, unspecified: Secondary | ICD-10-CM | POA: Diagnosis not present

## 2022-11-25 MED ORDER — NORTRIPTYLINE HCL 25 MG PO CAPS: 25.0000 mg | ORAL_CAPSULE | Freq: Every day | ORAL | 1 refills | Status: DC

## 2022-11-25 NOTE — Progress Notes (Signed)
BH MD OP Progress Note  11/25/2022 9:03 AM JEVONTA [name]  MRN:  [id number]  Chief Complaint:  Chief Complaint  Patient presents with   Follow-up   Depression   Anxiety   Medication Refill   HPI: Derrick [name] is a 51 year old Caucasian male, married, retired Tax inspector, currently employed part-time, lives in [place], has a history of GAD, MDD, trauma and stress related symptoms, prostatitis, hematospermia, BPH, nephrolithiasis was evaluated in office today, presented for a follow-up appointment.  Patient today reports overall he is currently doing well with regards to his mood symptoms.  Patient reports his tenant passed away of suicide beginning of October.  Patient reports he found him and had to call the sheriff's department.  Patient reports that was traumatic for him however currently he is overall coping with it better than before.  Patient reports he did try to get him help a year ago and at that time he was admitted to the hospital for his condition.  Patient however reports he did not follow through with recommendations after that which could have led to this.  Patient reports he has a therapist currently at [facility].  Patient reports therapy sessions as beneficial.  He had a recent appointment after the suicidal death of his tenant and that helped.  Patient is motivated to stay in therapy.  Does have some intrusive memories about what he could have done differently to help his tenant.  Patient however reports he is currently coping okay.  Patient also reports his wife is currently going through medical issues, recently fell and has a fracture.  Currently trying to support his wife as well.  Patient denies any suicidality, homicidality or perceptual disturbances.  Patient reports sleep is overall good.  Currently uses CPAP.  Using the CPAP may also have improved his mood symptoms.  Patient is currently taking the nortriptyline 25 mg daily  only since he did not tolerate the higher dosage.  He would like to stay on this dosage.  Denies side effects.  Patient denies any current suicidality, homicidality or perceptual disturbances.  Patient denies any other concerns today.  Visit Diagnosis:    ICD-10-CM   1. GAD (generalized anxiety disorder)  F41.1 nortriptyline (PAMELOR) 25 MG capsule    2. Recurrent major depressive disorder, in full remission (HCC)  F33.42     3. Trauma and stressor-related disorder  F43.9 nortriptyline (PAMELOR) 25 MG capsule   unspecified , R/O PTSD      Past Psychiatric History: I have reviewed past psychiatric history from progress note on 05/30/2022.  Medications like Celexa, Paxil, Wellbutrin, clonazepam.  Past Medical History:  Past Medical History:  Diagnosis Date   Anxiety    Depression    GERD (gastroesophageal reflux disease)    History of kidney stones    Renal disorder    kidney stones   Sleep apnea     Past Surgical History:  Procedure Laterality Date   CYSTOSCOPY/URETEROSCOPY/HOLMIUM LASER/STENT PLACEMENT Left 01/14/2018   Procedure: CYSTOSCOPY/URETEROSCOPY/HOLMIUM LASER/STENT PLACEMENT;  Surgeon: Vanna Scotland, MD;  Location: [facility] ORS;  Service: Urology;  Laterality: Left;   VASECTOMY      Family Psychiatric History: Reviewed family psychiatric history from progress note on 05/30/2022.  Family History:  Family History  Problem Relation Age of Onset   Dementia Father    Dementia Paternal Aunt    Parkinsonism Maternal Grandfather    Dementia Paternal Grandmother    Renal cancer Neg Hx  Prostate cancer Neg Hx     Social History: Reviewed social history from progress note on 05/30/2022. 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 [place]   Occupation: Emergency management    Employer: [employer]    Comment: Part Time  Tobacco Use   Smoking status: Never    Passive exposure: Never   Smokeless tobacco: Current    Types: Snuff  Vaping Use   Vaping status: Never Used  Substance and Sexual Activity   Alcohol use: Yes    Comment: occasionally   Drug use: Not Currently    Types: Marijuana   Sexual activity: Yes    Birth control/protection: None  Other Topics Concern   Not on file  Social History Narrative   Not on file   Social Determinants of Health   Financial Resource Strain: Not on file  Food Insecurity: Not on file  Transportation Needs: Not on file  Physical Activity: Not on file  Stress: Not on file  Social Connections: Not on file    Allergies: No Known Allergies  Metabolic Disorder Labs: No results found for: "HGBA1C", "MPG" No results found for: "PROLACTIN" Lab Results  Component Value Date   CHOL 151 03/18/2022   TRIG 46 03/18/2022   HDL 53 03/18/2022   CHOLHDL 2.8 03/18/2022   LDLCALC 88 03/18/2022   LDLCALC 95 03/05/2021   Lab Results  Component Value Date   TSH 0.668 03/18/2022   TSH 0.908 03/05/2021    Therapeutic Level Labs: No results found for: "LITHIUM" No results found for: "VALPROATE" No results found for: "CBMZ"  Current Medications: Current Outpatient Medications  Medication Sig Dispense Refill   famotidine (PEPCID) 20 MG tablet Take 20 mg by mouth daily.     hydrocortisone (ANUSOL-HC) 2.5 % rectal cream Apply topically.     hydrOXYzine (VISTARIL) 25 MG capsule Take 2 capsules (50 mg total) by mouth every 8 (eight) hours as needed for anxiety. 30 capsule 0   psyllium (METAMUCIL) 58.6 % powder Take 1 packet by mouth daily.     tamsulosin (FLOMAX) 0.4 MG CAPS capsule TAKE 1 CAPSULE(0.4 MG) BY MOUTH DAILY 90 capsule 1   nortriptyline (PAMELOR) 25 MG capsule Take 1 capsule (25 mg total) by mouth at bedtime. 90 capsule 1    No current facility-administered medications for this visit.     Musculoskeletal: Strength & Muscle Tone: within normal limits Gait & Station: normal Patient leans: N/A  Psychiatric Specialty Exam: Review of Systems  Psychiatric/Behavioral:         Grieving-coping okay    Blood pressure 124/76, pulse 69, temperature 98 F (36.7 C), temperature source Skin, height 5\' 9"  (1.753 m), weight 199 lb 12.8 oz (90.6 kg).Body mass index is 29.51 kg/m.  General Appearance: Fairly Groomed  Eye Contact:  Fair  Speech:  Clear and Coherent  Volume:  Normal  Mood:   Grieving-coping okay  Affect:  Appropriate  Thought Process:  Goal Directed and Descriptions of Associations: Intact  Orientation:  Full (Time, Place, and Person)  Thought Content: Logical   Suicidal Thoughts:  No  Homicidal Thoughts:  No  Memory:  Immediate;   Fair Recent;   Fair Remote;   Fair  Judgement:  Fair  Insight:  Fair  Psychomotor Activity:  Normal  Concentration:  Concentration: Fair and Attention Span: Fair  Recall:  [employer] of Knowledge: Fair  Language: Fair  Akathisia:  No  Handed:  Right  AIMS (if indicated): not done  Assets:  Communication Skills Desire for Improvement Housing Social Support  ADL's:  Intact  Cognition: WNL  Sleep:  Fair   Screenings: GAD-7    Garment/textile technologist Visit from 11/25/2022 in [facility] Office Visit from 09/23/2022 in [facility] Office Visit from 05/30/2022 in [facility]  Total GAD-7 Score 1 6 10       PHQ2-9    Flowsheet Row Office Visit from 11/25/2022 in [facility] Office Visit from 09/23/2022 in [facility] Office Visit from 05/30/2022 in [facility]  PHQ-2 Total Score 0 2 3  PHQ-9 Total Score 0 3 11       Flowsheet Row Office Visit from 11/25/2022 in [facility] Office Visit from 09/23/2022 in [facility] Office Visit from 05/30/2022 in [facility]  C-SSRS RISK CATEGORY No Risk No Risk No Risk        Assessment and Plan: RISHAN ORTOLANI is a 52 year old Caucasian male, retired IT sales professional, currently works part-time, lives in [place], has a history of GAD, MDD, multiple medical problems currently grieving the loss of a friend, although managing his mood symptoms well on the current medication regimen and CBT, motivated to stay in therapy.  Plan as noted below.  Plan GAD-stable Nortriptyline 25 mg p.o. nightly.  Patient did not tolerate the higher dosage. Continue CBT with Mr. Noe [facility] at [facility].  MDD in remission Nortriptyline 25 mg at bedtime Hydroxyzine 25 mg p.o. twice daily as needed Patient has been limiting use of the hydroxyzine.  Trauma and stress related disorder-unspecified-rule out PTSD-improving Patient to continue CBT.   Follow-up in clinic in 3 months or sooner if needed.  Collaboration of Care: Collaboration of Care: Referral or follow-up with counselor/therapist AEB patient encouraged to continue CBT  Patient/Guardian was advised Release of Information must be obtained prior to any record release in order to collaborate their care with an outside provider. Patient/Guardian was advised if they have not already done so to contact the registration department to sign all necessary forms in order for [place] to release information regarding their care.   Consent: Patient/Guardian gives verbal consent for treatment and assignment of benefits for services provided during this visit. Patient/Guardian expressed understanding and agreed to proceed.  This note was generated in part or whole with voice recognition software. Voice recognition is usually quite  accurate but there are transcription errors that can and very often do occur. I apologize for any typographical errors that were not detected and corrected.     Jomarie Longs, MD 11/25/2022, 9:03 AM

## 2022-12-03 DIAGNOSIS — G4733 Obstructive sleep apnea (adult) (pediatric): Secondary | ICD-10-CM | POA: Diagnosis not present

## 2022-12-04 DIAGNOSIS — G4733 Obstructive sleep apnea (adult) (pediatric): Secondary | ICD-10-CM | POA: Diagnosis not present

## 2022-12-17 ENCOUNTER — Encounter: Payer: Self-pay | Admitting: Behavioral Health

## 2022-12-17 ENCOUNTER — Ambulatory Visit (INDEPENDENT_AMBULATORY_CARE_PROVIDER_SITE_OTHER): Payer: 59 | Admitting: Behavioral Health

## 2022-12-17 DIAGNOSIS — F411 Generalized anxiety disorder: Secondary | ICD-10-CM | POA: Diagnosis not present

## 2022-12-17 NOTE — Progress Notes (Signed)
                Yurika Pereda M Shanzay Hepworth, LCMHC 

## 2022-12-17 NOTE — Progress Notes (Signed)
[facility] Counselor/Therapist Progress Note  Patient ID: Derrick [name], MRN: [id number],    Date: 12/17/2022  Time Spent: 45 minutes, 2 PM until 2:45 PM.This session was held via video teletherapy. The patient consented to the video teletherapy and was located in his home during this session. He is aware it is the responsibility of the patient to secure confidentiality on her end of the session. The provider was in a private home office for the duration of this session.      Treatment Type: Individual Therapy  Reported Symptoms: Anxiety  Mental Status Exam: Appearance:  Well Groomed     Behavior: Appropriate  Motor: Normal  Speech/Language:  Normal Rate  Affect: Appropriate  Mood: normal  Thought process: normal  Thought content:   WNL  Sensory/Perceptual disturbances:   WNL  Orientation: oriented to person, place, time/date, situation, day of week, month of year, and year  Attention: Good  Concentration: Good  Memory: WNL  Fund of knowledge:  Good  Insight:   Good  Judgment:  Good  Impulse Control: Good   Risk Assessment: Danger to Self:  No Self-injurious Behavior: No Danger to Others: No Duty to Warn:no Physical Aggression / Violence:No  Access to Firearms a concern: No  Gang Involvement:No   Subjective: The patient has seen some reduction in his anxiety since our last session.  He feels the nortriptyline is working well.  There have been some positive changes.  The situation involving the renter on his property has been settled and that has taken some stress off of him.  His wife fell shortly after our last session and fractured her shoulder but is having physical therapy now and he knows how strongly determined she is.  That was concerning for a while but he says for the most part he tries she is going to heal well.  There has been no change in the situation with his son and daughter-in-law but he is learning to let go knowing that they have done all  that they can.  There will be a time when he can help his son especially getting his house started.  There is some concern about his father's health as he is in the fairly early stages of dementia and has poor short-term memory.  We talked about the difficulty in watching parents age but how we can best help them mentally emotionally and physically etc.  He has been using the grounding exercise and breathing exercises with success and so today we introduced the vagus nerve stimulation and also some mindfulness moments to use cognitive reframing of anxious thoughts.  He feels that he is doing well and will be okay to meet in January.  That appointment is scheduled.  He has my email address if he needs to schedule something sooner.  Interventions: Cognitive Behavioral Therapy and Dialectical Behavioral Therapy  Diagnosis: Generalized anxiety disorder  Plan: I will meet with the patient every 4 to 6 weeks via care agility.  Treatment plan: We will use cognitive behavioral therapy and elements of dialectical behavior therapy to reduce the patient's anxiety by at least 50% with a target date of May 28, 2023.  Goals will be to help the patient find healthy ways to manage his anxiety symptoms and stress, identify causes for anxiety and find ways to lower it.  We will also resolve and explore any of the core conflicts that are contributing to the cause of anxiety as well as manage thoughts and worrisome thinking contributing  to feelings of anxiety.  Interventions include providing education about anxiety to help him recognize anxiety causes symptoms and triggers.  We will facilitate problem solution skills as well as teach coping skills for managing anxiety.  We will also use cognitive behavioral therapy to identify and change anxiety provoking thought and behavior patterns as well as dialectical behavior therapy to teach distress tolerance and mindfulness skills.  Progress 30%  French Ana,  [facility]

## 2022-12-30 DIAGNOSIS — R3129 Other microscopic hematuria: Secondary | ICD-10-CM

## 2022-12-30 MED ORDER — TAMSULOSIN HCL 0.4 MG PO CAPS: 0.4000 mg | ORAL_CAPSULE | Freq: Every day | ORAL | 1 refills | Status: DC

## 2022-12-30 NOTE — Telephone Encounter (Signed)
Ok to refill? No future appointments scheduled.

## 2023-01-02 DIAGNOSIS — G4733 Obstructive sleep apnea (adult) (pediatric): Secondary | ICD-10-CM | POA: Diagnosis not present

## 2023-01-03 DIAGNOSIS — G4733 Obstructive sleep apnea (adult) (pediatric): Secondary | ICD-10-CM | POA: Diagnosis not present

## 2023-01-17 ENCOUNTER — Telehealth: Payer: Self-pay

## 2023-01-17 DIAGNOSIS — K649 Unspecified hemorrhoids: Secondary | ICD-10-CM

## 2023-01-17 DIAGNOSIS — K648 Other hemorrhoids: Secondary | ICD-10-CM

## 2023-01-17 NOTE — Telephone Encounter (Signed)
Anuel called [facility] requesting a Surgical referral for hemorroids.  States he's been dealing with them off & on for the past year.  Informed me that  [facility] Liborio Nixon [place], [place]) prescribed him Metamucil, then Benefiber and Anusol-HC cream.  He's had minimal results with these medications.  Celester had annual physical 03/25/2022 with Nona Dell, PA-C at [facility].  Colonoscopy was 03/14/2022 with Dr. Thomos Lemons [facility]).  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  Surgical referral put Epic for Dr. Carolan Shiver.  AMD

## 2023-01-24 DIAGNOSIS — K648 Other hemorrhoids: Secondary | ICD-10-CM | POA: Diagnosis not present

## 2023-01-24 DIAGNOSIS — K644 Residual hemorrhoidal skin tags: Secondary | ICD-10-CM | POA: Diagnosis not present

## 2023-02-02 DIAGNOSIS — G4733 Obstructive sleep apnea (adult) (pediatric): Secondary | ICD-10-CM | POA: Diagnosis not present

## 2023-02-03 DIAGNOSIS — G4733 Obstructive sleep apnea (adult) (pediatric): Secondary | ICD-10-CM | POA: Diagnosis not present

## 2023-02-10 ENCOUNTER — Ambulatory Visit: Payer: 59 | Admitting: Behavioral Health

## 2023-02-18 DIAGNOSIS — K644 Residual hemorrhoidal skin tags: Secondary | ICD-10-CM | POA: Diagnosis not present

## 2023-02-18 DIAGNOSIS — K648 Other hemorrhoids: Secondary | ICD-10-CM | POA: Diagnosis not present

## 2023-02-25 ENCOUNTER — Telehealth (INDEPENDENT_AMBULATORY_CARE_PROVIDER_SITE_OTHER): Payer: 59 | Admitting: Psychiatry

## 2023-02-25 ENCOUNTER — Encounter: Payer: Self-pay | Admitting: Psychiatry

## 2023-02-25 DIAGNOSIS — F411 Generalized anxiety disorder: Secondary | ICD-10-CM | POA: Diagnosis not present

## 2023-02-25 DIAGNOSIS — F439 Reaction to severe stress, unspecified: Secondary | ICD-10-CM | POA: Diagnosis not present

## 2023-02-25 DIAGNOSIS — F3342 Major depressive disorder, recurrent, in full remission: Secondary | ICD-10-CM | POA: Diagnosis not present

## 2023-02-25 NOTE — Progress Notes (Signed)
Virtual Visit via Video Note  I connected with Derrick [name] on 02/25/23 at  8:30 AM EST by a video enabled telemedicine application and verified that I am speaking with the correct person using two identifiers.  Location Provider Location : [facility] Patient Location : Work  Participants: Patient , Provider    I discussed the limitations of evaluation and management by telemedicine and the availability of in person appointments. The patient expressed understanding and agreed to proceed.   I discussed the assessment and treatment plan with the patient. The patient was provided an opportunity to ask questions and all were answered. The patient agreed with the plan and demonstrated an understanding of the instructions.   The patient was advised to call back or seek an in-person evaluation if the symptoms worsen or if the condition fails to improve as anticipated.   BH MD OP Progress Note  02/25/2023 9:14 AM Derrick [name]  MRN:  [id number]  Chief Complaint:  Chief Complaint  Patient presents with   Follow-up   Anxiety   Depression   Medication Refill   HPI: Derrick [name] is a 53 year old Caucasian male, married, retired Tax inspector, currently employed part-time, lives in [place], has a history of GAD, MDD, trauma and stress related symptoms, obstructive sleep apnea on CPAP, prostatitis, hematospermia, BPH, nephrolithiasis was evaluated by telemedicine today.   The patient, with generalized anxiety disorder, major depression, and trauma related symptoms presents for follow-up.  He has completed four therapy sessions with Serafina Mitchell, the last being in November 2024. He feels that the therapy was beneficial and has been managing well since then. He canceled a follow-up appointment in January as he felt he was in a good place.  He has been on nortriptyline for approximately eight to nine months and feels it has been effective in managing his symptoms. He reports being in  a much better place now compared to a year ago, with improved management of everyday stressors and family issues. No significant worrying, racing thoughts, or any symptoms he cannot cope with. He has been using techniques learned in therapy, such as breathing exercises and focusing on other things, to manage anxiety.  He mentions experiencing jaw clenching, which he noticed before starting nortriptyline. This symptom has eased somewhat in the past month. He finds himself clenching his jaw during concentration but tries to relax when he notices it.  He reports improved sleep quality since obtaining a new CPAP machine four to five months ago, with no nightmares or disturbances. No thoughts of self-harm or harm to others. His appetite is stable, and he is mindful of his diet and exercise.  He has hydroxyzine prescribed for anxiety flare-ups, which he uses sparingly, having taken less than ten capsules since prescribed. He is cautious about using it with nortriptyline due to potential side effects.   Denies any homicidality or perceptual disturbances.   Visit Diagnosis:    ICD-10-CM   1. Generalized anxiety disorder  F41.1     2. Recurrent major depressive disorder, in full remission (HCC)  F33.42     3. Trauma and stressor-related disorder  F43.9    other specified - adjustment like disorder with prolonged duration of more than 6 months without prolonged duration of stressors      Past Psychiatric History: I have reviewed past psychiatric history from progress note on 05/30/2022.  Past trials of medications like Celexa, Paxil, Wellbutrin, clonazepam.  Past Medical History:  Past Medical History:  Diagnosis  Date   Anxiety    Depression    GERD (gastroesophageal reflux disease)    History of kidney stones    Renal disorder    kidney stones   Sleep apnea     Past Surgical History:  Procedure Laterality Date   CYSTOSCOPY/URETEROSCOPY/HOLMIUM LASER/STENT PLACEMENT Left 01/14/2018    Procedure: CYSTOSCOPY/URETEROSCOPY/HOLMIUM LASER/STENT PLACEMENT;  Surgeon: Vanna Scotland, MD;  Location: [facility] ORS;  Service: Urology;  Laterality: Left;   VASECTOMY      Family Psychiatric History: I have reviewed family psychiatric history from progress note on 05/30/2022.  Family History:  Family History  Problem Relation Age of Onset   Dementia Father    Dementia Paternal Aunt    Parkinsonism Maternal Grandfather    Dementia Paternal Grandmother    Renal cancer Neg Hx    Prostate cancer Neg Hx     Social History: I have reviewed social history from progress note on 05/30/2022. 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 [place]   Occupation: Emergency management    Employer: [employer]    Comment: Part Time  Tobacco Use   Smoking status: Never    Passive exposure: Never   Smokeless tobacco: Current    Types: Snuff  Vaping Use   Vaping status: Never Used  Substance and Sexual Activity   Alcohol use: Yes    Comment: occasionally   Drug use: Not Currently    Types: Marijuana   Sexual activity: Yes    Birth control/protection: None  Other Topics Concern   Not on file  Social History Narrative   Not on file   Social Drivers of Health   Financial Resource Strain: Low Risk  (01/24/2023)   Received from [facility]   Overall Financial Resource Strain (CARDIA)    Difficulty of Paying Living Expenses: Not hard at all  Food Insecurity: No Food Insecurity (01/24/2023)   Received from [facility]   Hunger Vital Sign    Worried About Running Out of Food in the Last Year: Never true    Ran Out of Food in the Last Year: Never true  Transportation Needs: No  Transportation Needs (01/24/2023)   Received from [facility] - Transportation    In the past 12 months, has lack of transportation kept you from medical appointments or from getting medications?: No    Lack of Transportation (Non-Medical): No  Physical Activity: Not on file  Stress: Not on file  Social Connections: Not on file    Allergies: No Known Allergies  Metabolic Disorder Labs: No results found for: "HGBA1C", "MPG" No results found for: "PROLACTIN" Lab Results  Component Value Date   CHOL 151 03/18/2022   TRIG 46 03/18/2022   HDL 53 03/18/2022   CHOLHDL 2.8 03/18/2022   LDLCALC 88 03/18/2022   LDLCALC 95 03/05/2021   Lab Results  Component Value Date   TSH 0.668 03/18/2022   TSH 0.908 03/05/2021    Therapeutic Level Labs: No results found for: "LITHIUM" No results found for: "VALPROATE" No results found for: "CBMZ"  Current Medications: Current Outpatient Medications  Medication Sig Dispense Refill   ANUCORT-HC 25 MG suppository Place 25 mg  rectally 2 (two) times daily.     famotidine (PEPCID) 20 MG tablet Take 20 mg by mouth daily.     hydrocortisone (ANUSOL-HC) 2.5 % rectal cream Apply topically.     hydrOXYzine (VISTARIL) 25 MG capsule Take 2 capsules (50 mg total) by mouth every 8 (eight) hours as needed for anxiety. 30 capsule 0   nortriptyline (PAMELOR) 25 MG capsule Take 1 capsule (25 mg total) by mouth at bedtime. 90 capsule 1   psyllium (METAMUCIL) 58.6 % powder Take 1 packet by mouth daily.     tamsulosin (FLOMAX) 0.4 MG CAPS capsule Take 1 capsule (0.4 mg total) by mouth daily. 90 capsule 1   No current facility-administered medications for this visit.     Musculoskeletal: Strength & Muscle Tone:  UTA Gait & Station:  Seated Patient leans: N/A  Psychiatric Specialty Exam: Review of Systems  Psychiatric/Behavioral: Negative.      There were no vitals taken for this visit.There is no height or weight on file to  calculate BMI.  General Appearance: Casual  Eye Contact:  Fair  Speech:  Normal Rate  Volume:  Normal  Mood:  Euthymic  Affect:  Congruent  Thought Process:  Goal Directed and Descriptions of Associations: Intact  Orientation:  Full (Time, Place, and Person)  Thought Content: Logical   Suicidal Thoughts:  No  Homicidal Thoughts:  No  Memory:  Immediate;   Fair Recent;   Fair Remote;   Fair  Judgement:  Fair  Insight:  Fair  Psychomotor Activity:  Normal  Concentration:  Concentration: Fair and Attention Span: Fair  Recall:  [employer] of Knowledge: Fair  Language: Fair  Akathisia:  No  Handed:  Right  AIMS (if indicated): not done  Assets:  Desire for Improvement Housing Talents/Skills  ADL's:  Intact  Cognition: WNL  Sleep:  Fair   Screenings: GAD-7    Flowsheet Row Office Visit from 11/25/2022 in [facility] Office Visit from 09/23/2022 in [facility] Office Visit from 05/30/2022 in [facility]  Total GAD-7 Score 1 6 10       PHQ2-9    Flowsheet Row Office Visit from 11/25/2022 in [facility] Office Visit from 09/23/2022 in [facility] Office Visit from 05/30/2022 in [facility]  PHQ-2 Total Score 0 2 3  PHQ-9 Total Score 0 3 11      Flowsheet Row Video Visit from 02/25/2023 in [facility] Office Visit from 11/25/2022 in [facility] Office Visit from 09/23/2022 in [facility]  C-SSRS RISK CATEGORY No Risk No Risk No Risk        Assessment and Plan: Derrick [name] is a 52 year old Caucasian male, retired IT sales professional, currently works part-time, reports mood symptoms as improved on the current medication  regimen and psychotherapy, discussed assessment and plan as noted below.  Generalized Anxiety Disorder-stable Significant improvement in anxiety symptoms with nortriptyline and therapy. Techniques from therapy (breathing, focusing) have been helpful. Occasional jaw clenching noted but has eased recently. Hydroxyzine used as needed for anxiety flare-ups. - Continue nortriptyline 25 mg at bedtime - Use hydroxyzine 25 mg as needed for anxiety flare-ups - Discuss jaw clenching with dentist or ENT specialist - Follow-up with therapist Mr. Serafina Mitchell if needed  Major Depressive Disorder in remission Much improved compared to  a year ago. No significant depressive symptoms. Nortriptyline and therapy effective. - Continue nortriptyline as prescribed. - Monitor for depressive symptoms and follow-up if needed  Other specified trauma and stress related disorder-improving Well-managed symptoms. No nightmares or significant distress. Therapy techniques beneficial. - Continue nortriptyline and therapy techniques - Follow-up with therapist Serafina Mitchell if needed - Continue using CPAP machine - Monitor sleep quality and follow-up if issues arise  Follow-up - Schedule follow-up appointment on May 5th at 4 PM via virtual visit.   Collaboration of Care: Collaboration of Care: Referral or follow-up with counselor/therapist AEB patient encouraged to continue CBT as needed.  Patient/Guardian was advised Release of Information must be obtained prior to any record release in order to collaborate their care with an outside provider. Patient/Guardian was advised if they have not already done so to contact the registration department to sign all necessary forms in order for [place] to release information regarding their care.   Consent: Patient/Guardian gives verbal consent for treatment and assignment of benefits for services provided during this visit. Patient/Guardian expressed understanding and agreed to proceed.    This note was generated in part or whole with voice recognition software. Voice recognition is usually quite accurate but there are transcription errors that can and very often do occur. I apologize for any typographical errors that were not detected and corrected.    Jomarie Longs, MD 02/25/2023, 9:14 AM

## 2023-03-05 DIAGNOSIS — G4733 Obstructive sleep apnea (adult) (pediatric): Secondary | ICD-10-CM | POA: Diagnosis not present

## 2023-03-10 ENCOUNTER — Ambulatory Visit: Payer: Self-pay

## 2023-03-10 DIAGNOSIS — Z0000 Encounter for general adult medical examination without abnormal findings: Secondary | ICD-10-CM

## 2023-03-10 LAB — POCT URINALYSIS DIPSTICK
Bilirubin, UA: NEGATIVE
Glucose, UA: NEGATIVE
Ketones, UA: NEGATIVE
Leukocytes, UA: NEGATIVE
Nitrite, UA: NEGATIVE
Protein, UA: NEGATIVE
Spec Grav, UA: 1.025 (ref 1.010–1.025)
Urobilinogen, UA: 0.2 U/dL
pH, UA: 6 (ref 5.0–8.0)

## 2023-03-10 NOTE — Progress Notes (Signed)
 Pt completed labs for physical. Gretel Acre

## 2023-03-11 LAB — CMP12+LP+TP+TSH+6AC+PSA+CBC…
ALT: 20 [IU]/L (ref 0–44)
AST: 23 [IU]/L (ref 0–40)
Albumin: 4.5 g/dL (ref 3.8–4.9)
Alkaline Phosphatase: 103 [IU]/L (ref 44–121)
BUN/Creatinine Ratio: 16 (ref 9–20)
BUN: 15 mg/dL (ref 6–24)
Basophils Absolute: 0.1 10*3/uL (ref 0.0–0.2)
Basos: 1 %
Bilirubin Total: 0.8 mg/dL (ref 0.0–1.2)
Calcium: 9 mg/dL (ref 8.7–10.2)
Chloride: 102 mmol/L (ref 96–106)
Chol/HDL Ratio: 2.9 {ratio} (ref 0.0–5.0)
Cholesterol, Total: 182 mg/dL (ref 100–199)
Creatinine, Ser: 0.96 mg/dL (ref 0.76–1.27)
EOS (ABSOLUTE): 0.4 10*3/uL (ref 0.0–0.4)
Eos: 7 %
Estimated CHD Risk: <0.5 times avg. (ref 0.0–1.0)
Free Thyroxine Index: 1.6 (ref 1.2–4.9)
GGT: 22 [IU]/L (ref 0–65)
Globulin, Total: 2.5 g/dL (ref 1.5–4.5)
Glucose: 98 mg/dL (ref 70–99)
HDL: 63 mg/dL (ref >39)
Hematocrit: 49.8 % (ref 37.5–51.0)
Hemoglobin: 17.1 g/dL (ref 13.0–17.7)
Immature Grans (Abs): 0.1 10*3/uL (ref 0.0–0.1)
Immature Granulocytes: 1 %
Iron: 108 ug/dL (ref 38–169)
LDH: 187 [IU]/L (ref 121–224)
LDL Chol Calc (NIH): 107 mg/dL — ABNORMAL HIGH (ref 0–99)
Lymphocytes Absolute: 1.3 10*3/uL (ref 0.7–3.1)
Lymphs: 21 %
MCH: 32.3 pg (ref 26.6–33.0)
MCHC: 34.3 g/dL (ref 31.5–35.7)
MCV: 94 fL (ref 79–97)
Monocytes Absolute: 0.6 10*3/uL (ref 0.1–0.9)
Monocytes: 11 %
Neutrophils Absolute: 3.5 10*3/uL (ref 1.4–7.0)
Neutrophils: 59 %
Phosphorus: 3.5 mg/dL (ref 2.8–4.1)
Platelets: 237 10*3/uL (ref 150–450)
Potassium: 4.5 mmol/L (ref 3.5–5.2)
Prostate Specific Ag, Serum: 1 ng/mL (ref 0.0–4.0)
RBC: 5.3 x10E6/uL (ref 4.14–5.80)
RDW: 11.8 % (ref 11.6–15.4)
Sodium: 139 mmol/L (ref 134–144)
T3 Uptake Ratio: 29 % (ref 24–39)
T4, Total: 5.4 ug/dL (ref 4.5–12.0)
TSH: 0.861 u[IU]/mL (ref 0.450–4.500)
Total Protein: 7 g/dL (ref 6.0–8.5)
Triglycerides: 61 mg/dL (ref 0–149)
Uric Acid: 4.4 mg/dL (ref 3.8–8.4)
VLDL Cholesterol Cal: 12 mg/dL (ref 5–40)
WBC: 5.9 10*3/uL (ref 3.4–10.8)
eGFR: 95 mL/min/{1.73_m2} (ref >59)

## 2023-03-17 ENCOUNTER — Ambulatory Visit: Payer: Self-pay | Admitting: Physician Assistant

## 2023-03-17 ENCOUNTER — Encounter: Payer: Self-pay | Admitting: Physician Assistant

## 2023-03-17 VITALS — BP 126/82 | HR 60 | Temp 97.6°F | Resp 12 | Ht 69.0 in | Wt 200.0 lb

## 2023-03-17 DIAGNOSIS — Z0000 Encounter for general adult medical examination without abnormal findings: Secondary | ICD-10-CM

## 2023-03-17 NOTE — Progress Notes (Signed)
[facility] ____________________________________________   None    (approximate)  I have reviewed the triage vital signs and the nursing notes.   HISTORY  Chief Complaint No chief complaint on file.  HPI Derrick [name] is a 53 y.o. male patient presents for annual physical exam.           Past Medical History:  Diagnosis Date   Anxiety    Depression    GERD (gastroesophageal reflux disease)    History of kidney stones    Renal disorder    kidney stones   Sleep apnea     Patient Active Problem List   Diagnosis Date Noted   GAD (generalized anxiety disorder) 05/30/2022   Trauma and stressor-related disorder 05/30/2022   Hypersomnia with sleep apnea 05/30/2022   Depression 05/30/2022    Past Surgical History:  Procedure Laterality Date   CYSTOSCOPY/URETEROSCOPY/HOLMIUM LASER/STENT PLACEMENT Left 01/14/2018   Procedure: CYSTOSCOPY/URETEROSCOPY/HOLMIUM LASER/STENT PLACEMENT;  Surgeon: Vanna Scotland, MD;  Location: [facility] OR[facility];  Service: Urology;  Laterality: Left;   VASECTOMY      Prior to Admission medications   Medication Sig Start Date End Date Taking? Authorizing Provider  ANUCORT-HC 25 MG suppository Place 25 mg rectally 2 (two) times daily.    [provider]  famotidine (PEPCID) 20 MG tablet Take 20 mg by mouth daily.    [provider]  hydrocortisone (ANUSOL-HC) 2.5 % rectal cream Apply topically. 02/11/22   [provider]  hydrOXYzine (VISTARIL) 25 MG capsule Take 2 capsules (50 mg total) by mouth every 8 (eight) hours as needed for anxiety. 04/10/22   Joni Reining, PA-C  nortriptyline (PAMELOR) 25 MG capsule Take 1 capsule (25 mg total) by mouth at bedtime. 11/25/22   Jomarie Longs, MD  psyllium (METAMUCIL) 58.6 % powder Take 1 packet by mouth daily.    [provider]  tamsulosin (FLOMAX) 0.4 MG CAPS capsule Take 1 capsule (0.4 mg total) by mouth daily. 12/30/22   Vanna Scotland,  MD    Allergies Patient has no known allergies.  Family History  Problem Relation Age of Onset   Dementia Father    Dementia Paternal Aunt    Parkinsonism Maternal Grandfather    Dementia Paternal Grandmother    Renal cancer Neg Hx    Prostate cancer Neg Hx     Social History Social History   Tobacco Use   Smoking status: Never    Passive exposure: Never   Smokeless tobacco: Current    Types: Snuff  Vaping Use   Vaping status: Never Used  Substance Use Topics   Alcohol use: Yes    Comment: occasionally   Drug use: Not Currently    Types: Marijuana    Review of Systems Constitutional: No fever/chills Eyes: No visual changes. ENT: No sore throat. Cardiovascular: Denies chest pain. Respiratory: Denies shortness of breath. Gastrointestinal: No abdominal pain.  No nausea, no vomiting.  No diarrhea.  No constipation.  GERD Genitourinary: Negative for dysuria. Musculoskeletal: Negative for back pain. Skin: Negative for rash. Neurological: Negative for headaches, focal weakness or numbness. Psychiatric: Anxiety and depression   ____________________________________________   PHYSICAL EXAM:  VITAL SIGN BP 126/82  Pulse Rate 60  Temp 97.6 F (36.4 C)  Temp Source Temporal  Weight 200 lb (90.7 kg)  Height 5\' 9"  (1.753 m)  Resp 12  SpO2 96 %   Constitutional: Alert and oriented. Well appearing and in no acute distress. Eyes: Conjunctivae are normal. PERRL.  EOMI. Head: Atraumatic. Nose: No congestion/rhinnorhea. Mouth/Throat: Mucous membranes are moist.  Oropharynx non-erythematous. Neck: No stridor.  No cervical spine tenderness to palpation. Hematological/Lymphatic/Immunilogical: No cervical lymphadenopathy. Cardiovascular: Normal rate, regular rhythm. Grossly normal heart sounds.  Good peripheral circulation. Respiratory: Normal respiratory effort.  No retractions. Lungs CTAB. Gastrointestinal: Soft and nontender. No distention. No abdominal bruits. No CVA  tenderness. Genitourinary: Deferred Musculoskeletal: No lower extremity tenderness nor edema.  No joint effusions. Neurologic:  Normal speech and language. No gross focal neurologic deficits are appreciated. No gait instability. Skin:  Skin is warm, dry and intact. No rash noted. Psychiatric: Mood and affect are normal. Speech and behavior are normal.  ____________________________________________   LABS           Component Ref Range & Units (hover) 7 d ago (03/10/23) 8 mo ago (07/08/22) 11 mo ago (04/11/22) 11 mo ago (03/28/22) 12 mo ago (03/18/22) 1 yr ago (11/27/21) 1 yr ago (10/17/21)  Color, UA dark yellow Yellow R Yellow R Yellow R yellow Yellow R Yellow R  Clarity, UA clear    clear    Glucose, UA Negative    Negative    Bilirubin, UA neg Negative R Negative R Negative R neg Negative R Negative R  Ketones, UA neg Negative R Negative R Negative R neg Negative R Negative R  Spec Grav, UA 1.025 <1.005 Low  R 1.010 R 1.010 R 1.025 1.010 R 1.020 R  Blood, UA 1+ Abnormal  Trace Abnormal  R Trace Abnormal  R Trace Abnormal  R pos CM Trace Abnormal  R Trace Abnormal  R  pH, UA 6.0 5.5 R 6.0 R 6.0 R 6.0 6.0 R 6.0 R  Protein, UA Negative Negative R Negative R Negative R Negative Negative R Negative R  Urobilinogen, UA 0.2    0.2    Nitrite, UA neg Negative R Negative R Negative R neg Negative R Negative R  Leukocytes, UA Negative Negative Negative Negative Negative Negative Negative  Appearance     dark    Odor         Resulting Agency  [facility]  [facility]             View All Conversations on this Encounter               Component Ref Range & Units (hover) 7 d ago (03/10/23) 11 mo ago (04/03/22) 11 mo ago (04/03/22) 12 mo ago (03/18/22) 2 yr ago (03/05/21) 2 yr ago (05/25/20) 3 yr ago (05/04/19)  Glucose 98 106 High  CM  90 93 91 R 99 R  Uric Acid 4.4   3.7 Low  CM 4.2 CM 4.0 CM 4.3 CM  Comment:            Therapeutic target for gout patients: <6.0  BUN 15 9  R  9 10 13 12   Creatinine, Ser 0.96 0.88 R  0.97 1.07 1.01 1.06  eGFR 95   94 84 90   BUN/Creatinine Ratio 16   9 9 13 11   Sodium 139 135 R  137 140 138 138  Potassium 4.5 3.5 R  4.3 4.6 4.7 4.3  Chloride 102 105 R  102 104 101 101  Calcium 9.0 8.6 Low  R  8.6 Low  8.8 8.9 9.1  Phosphorus 3.5   2.8 3.6 3.0 2.9  Total Protein 7.0   6.6 7.0 6.8 7.1  Albumin 4.5   4.2 4.4 4.2 4.5 R  Globulin, Total  2.5   2.4 2.6 2.6 2.6  Bilirubin Total 0.8   0.7 0.7 0.8 0.6  Alkaline Phosphatase 103   81 95 85 88 R  LDH 187   139 152 168 151  AST 23   13 10 15 16   ALT 20   9 11 16 19   GGT 22   15 18 22 21   Iron 108   106 144 119 136  Cholesterol, Total 182   151 163 173 187  Triglycerides 61   46 50 51 43  HDL 63   53 58 55 61  VLDL Cholesterol Cal 12   10 10 10 8   LDL Chol Calc (NIH) 107 High    88 95 108 High  118 High   Chol/HDL Ratio 2.9   2.8 CM 2.8 CM 3.1 CM 3.1 CM  Comment:                                   T. Chol/HDL Ratio                                             Men  Women                               1/2 Avg.Risk  3.4    3.3                                   Avg.Risk  5.0    4.4                                2X Avg.Risk  9.6    7.1                                3X Avg.Risk 23.4   11.0  Estimated CHD Risk  < 0.5    < 0.5 CM  < 0.5 CM  < 0.5 CM  < 0.5 CM  Comment: The CHD Risk is based on the T. Chol/HDL ratio. Other factors affect CHD Risk such as hypertension, smoking, diabetes, severe obesity, and family history of premature CHD.  TSH 0.861   0.668 0.908 0.819 0.872  T4, Total 5.4   5.7 6.2 6.2 5.7  T3 Uptake Ratio 29   30 28  32 29  Free Thyroxine Index 1.6   1.7 1.7 2.0 1.7  Prostate Specific Ag, Serum 1.0   1.2 CM 0.8 CM 0.7 CM 0.7 CM  Comment: Roche ECLIA methodology. According to the American Urological Association, Serum PSA should decrease and remain at undetectable levels after radical prostatectomy. The AUA defines biochemical recurrence as an initial PSA value 0.2  ng/mL or greater followed by a subsequent confirmatory PSA value 0.2 ng/mL or greater. Values obtained with different assay methods or kits cannot be used interchangeably. Results cannot be interpreted as absolute evidence of the presence or absence of malignant disease.  WBC 5.9  6.0 R 4.7 6.1 5.3 4.7  RBC 5.30  5.31 R 5.00 5.38 5.18 5.42  Hemoglobin 17.1  16.8 R 16.4  17.3 16.5 16.9  Hematocrit 49.8  48.0 R 46.5 50.4 48.2 50.7  MCV 94  90.4 R 93 94 93 94  MCH 32.3  31.6 R 32.8 32.2 31.9 31.2  MCHC 34.3  35.0 R 35.3 34.3 34.2 33.3  RDW 11.8  11.7 R 11.7 11.8 11.9 11.8  Platelets 237  226 R 206 259 236 232  Neutrophils 59   52 55 55 51  Lymphs 21   29 27 24 28   Monocytes 11   10 9 11 11   Eos 7   7 7 8 7   Basos 1   1 1 1 1   Neutrophils Absolute 3.5   2.4 3.3 2.9 2.4  Lymphocytes Absolute 1.3   1.4 1.6 1.3 1.3  Monocytes Absolute 0.6   0.5 0.6 0.6 0.5  EOS (ABSOLUTE) 0.4   0.4 0.4 0.4 0.3  Basophils Absolute 0.1   0.1 0.1 0.1 0.1  Immature Granulocytes 1   1 1 1 2   Immature Grans (Abs) 0.1   0.0 0.1 0.1 0.1             ____________________________________________  EKG  Sinus bradycardia 57 bpm ____________________________________________  ____________________________________________   INITIAL IMPRESSION / ASSESSMENT AND PLAN  As part of my medical decision making, I reviewed the following data within the electronic MEDICAL RECORD NUMBER       No acute findings on physical exam, EKG, and labs.      ____________________________________________   FINAL CLINICAL IMPRESSION  Well exam  ED Discharge Orders     None        Note:  This document was prepared using Dragon voice recognition software and may include unintentional dictation errors.

## 2023-03-17 NOTE — Progress Notes (Signed)
 Pt presents today to complete physical, Pt denies any issues or concerns at this time/CL,RMA

## 2023-04-02 DIAGNOSIS — G4733 Obstructive sleep apnea (adult) (pediatric): Secondary | ICD-10-CM | POA: Diagnosis not present

## 2023-05-03 DIAGNOSIS — G4733 Obstructive sleep apnea (adult) (pediatric): Secondary | ICD-10-CM | POA: Diagnosis not present

## 2023-05-20 DIAGNOSIS — D225 Melanocytic nevi of trunk: Secondary | ICD-10-CM | POA: Diagnosis not present

## 2023-05-20 DIAGNOSIS — D2261 Melanocytic nevi of right upper limb, including shoulder: Secondary | ICD-10-CM | POA: Diagnosis not present

## 2023-05-20 DIAGNOSIS — D2262 Melanocytic nevi of left upper limb, including shoulder: Secondary | ICD-10-CM | POA: Diagnosis not present

## 2023-05-20 DIAGNOSIS — K648 Other hemorrhoids: Secondary | ICD-10-CM | POA: Diagnosis not present

## 2023-05-20 DIAGNOSIS — K644 Residual hemorrhoidal skin tags: Secondary | ICD-10-CM | POA: Diagnosis not present

## 2023-05-20 DIAGNOSIS — Z86006 Personal history of melanoma in-situ: Secondary | ICD-10-CM | POA: Diagnosis not present

## 2023-05-20 DIAGNOSIS — D2271 Melanocytic nevi of right lower limb, including hip: Secondary | ICD-10-CM | POA: Diagnosis not present

## 2023-05-20 DIAGNOSIS — Z85820 Personal history of malignant melanoma of skin: Secondary | ICD-10-CM | POA: Diagnosis not present

## 2023-06-02 ENCOUNTER — Telehealth (INDEPENDENT_AMBULATORY_CARE_PROVIDER_SITE_OTHER): Payer: Self-pay | Admitting: Psychiatry

## 2023-06-02 ENCOUNTER — Encounter: Payer: Self-pay | Admitting: Psychiatry

## 2023-06-02 DIAGNOSIS — F3342 Major depressive disorder, recurrent, in full remission: Secondary | ICD-10-CM

## 2023-06-02 DIAGNOSIS — F411 Generalized anxiety disorder: Secondary | ICD-10-CM | POA: Diagnosis not present

## 2023-06-02 DIAGNOSIS — F439 Reaction to severe stress, unspecified: Secondary | ICD-10-CM

## 2023-06-02 DIAGNOSIS — G4733 Obstructive sleep apnea (adult) (pediatric): Secondary | ICD-10-CM | POA: Diagnosis not present

## 2023-06-02 MED ORDER — NORTRIPTYLINE HCL 25 MG PO CAPS: 25.0000 mg | ORAL_CAPSULE | Freq: Every day | ORAL | 3 refills | Status: AC

## 2023-06-02 NOTE — Progress Notes (Signed)
 Virtual Visit via Video Note  I connected with Derrick [name] on 06/02/23 at  4:00 PM EDT by a video enabled telemedicine application and verified that I am speaking with the correct person using two identifiers.  Location Provider Location : [facility] Patient Location : Home  Participants: Patient , Provider    I discussed the limitations of evaluation and management by telemedicine and the availability of in person appointments. The patient expressed understanding and agreed to proceed.   I discussed the assessment and treatment plan with the patient. The patient was provided an opportunity to ask questions and all were answered. The patient agreed with the plan and demonstrated an understanding of the instructions.   The patient was advised to call back or seek an in-person evaluation if the symptoms worsen or if the condition fails to improve as anticipated.   BH MD OP Progress Note  06/02/2023 4:14 PM Derrick [name]  MRN:  [id number]  Chief Complaint:  Chief Complaint  Patient presents with   Follow-up   Depression   Anxiety   Medication Refill   Discussed the use of AI scribe software for clinical note transcription with the patient, who gave verbal consent to proceed.  History of Present Illness Derrick [name] is a 53 year old Caucasian male, married, retired Tax inspector, currently employed part-time, lives in [place], has a history of GAD, MDD, trauma and stress related disorder, obstructive sleep apnea on CPAP, prostatitis, hematospermia, BPH, nephrolithiasis was evaluated by telemedicine today.  He has been stable since his last visit three months ago, with effective medication management. He takes nortriptyline  25 mg daily and uses hydroxyzine  as needed, though he has not required it recently. No anxiety attacks, depression, hopelessness, or lack of interest in activities. He denies any thoughts of self-harm or harm to others and reports increased  confidence.  He has a history of jaw clenching, which has improved. He had a dental appointment this morning and is scheduled to see the dentist again on the 27th of this month to discuss a mouth guard. He uses a CPAP machine nightly for severe sleep apnea and is considering using a mouth guard in conjunction with the CPAP.  He has not seen his therapist, Lamond Pilot, since December after attending four or five sessions. They both felt he was in a good place and left the option open to return if needed.  He is currently working part-time with [employer] and a Physiological scientist company, staying busy with work.    Visit Diagnosis:    ICD-10-CM   1. Generalized anxiety disorder  F41.1     2. Recurrent major depressive disorder, in full remission (HCC)  F33.42     3. Trauma and stressor-related disorder  F43.9 nortriptyline  (PAMELOR ) 25 MG capsule   Other specified ,adjustment like disorder with prolonged duration of more than 6 months without prolonged stressors currently in remission      Past Psychiatric History: I have reviewed past psychiatric history from progress note on 05/30/2022.  Past trials of medications like Celexa , Paxil, Wellbutrin, clonazepam .  Past Medical History:  Past Medical History:  Diagnosis Date   Anxiety    Depression    GERD (gastroesophageal reflux disease)    History of kidney stones    Renal disorder    kidney stones   Sleep apnea     Past Surgical History:  Procedure Laterality Date   CYSTOSCOPY/URETEROSCOPY/HOLMIUM LASER/STENT PLACEMENT Left 01/14/2018   Procedure:  CYSTOSCOPY/URETEROSCOPY/HOLMIUM LASER/STENT PLACEMENT;  Surgeon: Dustin Gimenez, MD;  Location: [facility] ORS;  Service: Urology;  Laterality: Left;   VASECTOMY      Family Psychiatric History: I have reviewed family psychiatric history from progress note on 05/30/2022.  Family History:  Family History  Problem Relation Age of Onset   Dementia Father    Dementia  Paternal Aunt    Parkinsonism Maternal Grandfather    Dementia Paternal Grandmother    Renal cancer Neg Hx    Prostate cancer Neg Hx     Social History: I have reviewed social history from progress note on 05/30/2022. 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 [place]   Occupation: Emergency management    Employer: [employer]    Comment: Part Time  Tobacco Use   Smoking status: Never    Passive exposure: Never   Smokeless tobacco: Current    Types: Snuff  Vaping Use   Vaping status: Never Used  Substance and Sexual Activity   Alcohol use: Yes    Comment: occasionally   Drug use: Not Currently    Types: Marijuana   Sexual activity: Yes    Birth control/protection: None  Other Topics Concern   Not on file  Social History Narrative   Not on file   Social Drivers of Health   Financial Resource Strain: Low Risk  (01/24/2023)   Received from [facility]   Overall Financial Resource Strain (CARDIA)    Difficulty of Paying Living Expenses: Not hard at all  Food Insecurity: No Food Insecurity (01/24/2023)   Received from [facility]   Hunger Vital Sign    Worried About Running Out of Food in the Last Year: Never true    Ran Out of Food in the Last Year: Never true  Transportation Needs: No Transportation Needs (01/24/2023)   Received from [facility] - Transportation    In the past 12 months, has lack of transportation kept you from medical appointments or from getting medications?: No    Lack of Transportation (Non-Medical): No  Physical Activity: Not on file  Stress: Not on file  Social Connections: Not on file    Allergies: No  Known Allergies  Metabolic Disorder Labs: No results found for: "HGBA1C", "MPG" No results found for: "PROLACTIN" Lab Results  Component Value Date   CHOL 182 03/10/2023   TRIG 61 03/10/2023   HDL 63 03/10/2023   CHOLHDL 2.9 03/10/2023   LDLCALC 107 (H) 03/10/2023   LDLCALC 88 03/18/2022   Lab Results  Component Value Date   TSH 0.861 03/10/2023   TSH 0.668 03/18/2022    Therapeutic Level Labs: No results found for: "LITHIUM" No results found for: "VALPROATE" No results found for: "CBMZ"  Current Medications: Current Outpatient Medications  Medication Sig Dispense Refill   ANUCORT-HC 25 MG suppository Place 25 mg rectally 2 (two) times daily.     famotidine  (PEPCID ) 20 MG tablet Take 20 mg by mouth as needed.     hydrocortisone (ANUSOL-HC) 2.5 % rectal cream Apply topically.     psyllium (METAMUCIL) 58.6 % powder Take 1 packet by mouth daily.     tamsulosin  (FLOMAX ) 0.4 MG  CAPS capsule Take 1 capsule (0.4 mg total) by mouth daily. 90 capsule 1   hydrOXYzine  (VISTARIL ) 25 MG capsule Take 2 capsules (50 mg total) by mouth every 8 (eight) hours as needed for anxiety. (Patient not taking: Reported on 06/02/2023) 30 capsule 0   nortriptyline  (PAMELOR ) 25 MG capsule Take 1 capsule (25 mg total) by mouth at bedtime. 90 capsule 3   No current facility-administered medications for this visit.     Musculoskeletal: Strength & Muscle Tone:  UTA Gait & Station:  Seated Patient leans: N/A  Psychiatric Specialty Exam: Review of Systems  Psychiatric/Behavioral:  Positive for sleep disturbance.     There were no vitals taken for this visit.There is no height or weight on file to calculate BMI.  General Appearance: Fairly Groomed  Eye Contact:  Fair  Speech:  Normal Rate  Volume:  Normal  Mood:  Euthymic  Affect:  Congruent  Thought Process:  Goal Directed and Descriptions of Associations: Intact  Orientation:  Full (Time, Place, and Person)  Thought Content: Logical   Suicidal  Thoughts:  No  Homicidal Thoughts:  No  Memory:  Immediate;   Fair Recent;   Fair Remote;   Fair  Judgement:  Fair  Insight:  Fair  Psychomotor Activity:  Normal  Concentration:  Concentration: Fair and Attention Span: Fair  Recall:  [employer] of Knowledge: Fair  Language: Fair  Akathisia:  No  Handed:  Right  AIMS (if indicated): not done  Assets:  Desire for Improvement Housing Social Support Transportation  ADL's:  Intact  Cognition: WNL  Sleep:   improving   Screenings: GAD-7    Loss adjuster, chartered Office Visit from 11/25/2022 in [facility] Office Visit from 09/23/2022 in [facility] Office Visit from 05/30/2022 in [facility]  Total GAD-7 Score 1 6 10       PHQ2-9    Flowsheet Row Video Visit from 06/02/2023 in [facility] Office Visit from 11/25/2022 in [facility] Office Visit from 09/23/2022 in [facility] Office Visit from 05/30/2022 in [facility]  PHQ-2 Total Score 0 0 2 3  PHQ-9 Total Score -- 0 3 11      Flowsheet Row Video Visit from 06/02/2023 in [facility] Video Visit from 02/25/2023 in [facility] Office Visit from 11/25/2022 in [facility]  C-SSRS RISK CATEGORY No Risk No Risk No Risk        Assessment and Plan: Derrick [name] is a 53 year old Caucasian male, currently presents with improved mood symptoms and sleep, discussed assessment and plan as noted below.  Generalized anxiety disorder-stable Currently reports overall improvement in anxiety symptoms.  Compliant on nortriptyline  which has been beneficial.  Does have hydroxyzine  available although not using it.   No longer with therapist since he has been doing fairly well. - Continue Nortriptyline  25 mg at bedtime - Continue Hydroxyzine  25 mg as needed for anxiety attacks as needed.  MDD in remission Currently denies any depression symptoms managed well on nortriptyline . - Continue Nortriptyline  25 mg at bedtime  Other specified trauma and stress related disorder-resolved. Currently denies any significant symptoms.  Sleep is improving, currently on CPAP and is planning to get a dental guard. - Continue psychotherapy sessions if needed. - Continue CPAP for sleep. -  Continue sleep hygiene techniques.  Follow-up Follow-up in clinic in person in 5 months or sooner if needed.    Consent: Patient/Guardian gives verbal consent for treatment and assignment of benefits for services provided during this visit. Patient/Guardian expressed understanding and agreed to proceed.   This note was generated in part or whole with voice recognition software. Voice recognition is usually quite accurate but there are transcription errors that can and very often do occur. I apologize for any typographical errors that were not detected and corrected.    Kaleah Hagemeister, MD 06/03/2023, 10:04 AM

## 2023-06-09 ENCOUNTER — Ambulatory Visit (INDEPENDENT_AMBULATORY_CARE_PROVIDER_SITE_OTHER): Admitting: Physician Assistant

## 2023-06-09 ENCOUNTER — Encounter: Payer: Self-pay | Admitting: Physician Assistant

## 2023-06-09 VITALS — BP 146/88 | HR 58 | Ht 69.0 in | Wt 190.0 lb

## 2023-06-09 DIAGNOSIS — N50811 Right testicular pain: Secondary | ICD-10-CM | POA: Diagnosis not present

## 2023-06-09 DIAGNOSIS — R361 Hematospermia: Secondary | ICD-10-CM | POA: Diagnosis not present

## 2023-06-09 DIAGNOSIS — N401 Enlarged prostate with lower urinary tract symptoms: Secondary | ICD-10-CM | POA: Diagnosis not present

## 2023-06-09 DIAGNOSIS — R3912 Poor urinary stream: Secondary | ICD-10-CM

## 2023-06-09 DIAGNOSIS — R3129 Other microscopic hematuria: Secondary | ICD-10-CM

## 2023-06-09 DIAGNOSIS — N200 Calculus of kidney: Secondary | ICD-10-CM | POA: Diagnosis not present

## 2023-06-09 LAB — URINALYSIS, COMPLETE
Bilirubin, UA: NEGATIVE
Glucose, UA: NEGATIVE
Ketones, UA: NEGATIVE
Leukocytes,UA: NEGATIVE
Nitrite, UA: NEGATIVE
Protein,UA: NEGATIVE
Specific Gravity, UA: <1.005 — ABNORMAL LOW (ref 1.005–1.030)
Urobilinogen, Ur: 0.2 mg/dL (ref 0.2–1.0)
pH, UA: 6 (ref 5.0–7.5)

## 2023-06-09 LAB — MICROSCOPIC EXAMINATION
Bacteria, UA: NONE SEEN
Epithelial Cells (non renal): NONE SEEN /HPF (ref 0–10)

## 2023-06-09 MED ORDER — CELECOXIB 100 MG PO CAPS: 100.0000 mg | ORAL_CAPSULE | Freq: Two times a day (BID) | ORAL | 0 refills | Status: AC

## 2023-06-09 MED ORDER — SULFAMETHOXAZOLE-TRIMETHOPRIM 800-160 MG PO TABS: 1.0000 | ORAL_TABLET | Freq: Two times a day (BID) | ORAL | 0 refills | Status: AC

## 2023-06-09 MED ORDER — TAMSULOSIN HCL 0.4 MG PO CAPS: 0.8000 mg | ORAL_CAPSULE | Freq: Every day | ORAL | 2 refills | Status: DC | PRN

## 2023-06-09 MED ORDER — TAMSULOSIN HCL 0.4 MG PO CAPS: 0.4000 mg | ORAL_CAPSULE | Freq: Every day | ORAL | 3 refills | Status: AC

## 2023-06-09 NOTE — Progress Notes (Signed)
 06/09/2023 12:10 PM   Derrick [name] 09-22-1970 [id number]  CC: Chief Complaint  Patient presents with   Testicle Pain   HPI: Derrick [name] is a 53 y.o. male with PMH nephrolithiasis, high risk hematuria with benign workup in 2019, inflammatory prostatitis, hematospermia, and BPH who presents today for evaluation of testicular pain and hematospermia.   Today he reports right testicular pain that radiates into the right inguinal crease and hematospermia x 3 weeks.  The blood started bright red, but is now brown.  His pain has become dull and more intermittent over time.  He had similar symptoms last year around this time.  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  He remains on Flomax  daily and notices weak stream, sensations of incomplete emptying, and more urgent urination when he goes off of it.  He wonders if he can stay on it long-term.  In-office UA today positive for trace intact blood; urine microscopy pan negative.  PMH: Past Medical History:  Diagnosis Date   Anxiety    Depression    GERD (gastroesophageal reflux disease)    History of kidney stones    Renal disorder    kidney stones   Sleep apnea     Surgical History: Past Surgical History:  Procedure Laterality Date   CYSTOSCOPY/URETEROSCOPY/HOLMIUM LASER/STENT PLACEMENT Left 01/14/2018   Procedure: CYSTOSCOPY/URETEROSCOPY/HOLMIUM LASER/STENT PLACEMENT;  Surgeon: Dustin Gimenez, MD;  Location: [facility] ORS;  Service: Urology;  Laterality: Left;   VASECTOMY      Home Medications:  Allergies as of 06/09/2023   No Known Allergies      Medication List        Accurate as of Jun 09, 2023 12:10 PM. If you have any questions, ask your nurse or doctor.          Anucort-HC 25 MG suppository Generic drug: hydrocortisone Place 25 mg rectally 2 (two) times daily.   celecoxib 100 MG capsule Commonly known as: CeleBREX Take 1 capsule (100 mg total) by mouth 2 (two) times  daily for 14 days. Started by: Tanyia Grabbe   famotidine  20 MG tablet Commonly known as: PEPCID  Take 20 mg by mouth as needed.   hydrocortisone 2.5 % rectal cream Commonly known as: ANUSOL-HC Apply topically.   hydrOXYzine  25 MG capsule Commonly known as: VISTARIL  Take 2 capsules (50 mg total) by mouth every 8 (eight) hours as needed for anxiety.   nortriptyline  25 MG capsule Commonly known as: PAMELOR  Take 1 capsule (25 mg total) by mouth at bedtime.   psyllium 58.6 % powder Commonly known as: METAMUCIL Take 1 packet by mouth daily.   sulfamethoxazole-trimethoprim 800-160 MG tablet Commonly known as: BACTRIM DS Take 1 tablet by mouth 2 (two) times daily for 10 days. Started by: Kathreen Pare   tamsulosin  0.4 MG Caps capsule Commonly known as: FLOMAX  Take 1 capsule (0.4 mg total) by mouth daily. What changed: Another medication with the same name was added. Make sure you understand how and when to take each. Changed by: Tracie Lindbloom   tamsulosin  0.4 MG Caps capsule Commonly known as: FLOMAX  Take 2 capsules (0.8 mg total) by mouth daily as needed (prostatitis). What changed: You were already taking a medication with the same name, and this prescription was added. Make sure you understand how and when to take each. Changed by: Evonne Rinks        Allergies:  No Known Allergies  Family History: Family History  Problem  Relation Age of Onset   Dementia Father    Dementia Paternal Aunt    Parkinsonism Maternal Grandfather    Dementia Paternal Grandmother    Renal cancer Neg Hx    Prostate cancer Neg Hx     Social History:   reports that he has never smoked. He has never been exposed to tobacco smoke. His smokeless tobacco use includes snuff. He reports current alcohol use. He reports that he does not currently use drugs after having used the following drugs: Marijuana.  Physical Exam: BP (!) 146/88   Pulse (!) 58   Ht 5\' 9"   (1.753 m)   Wt 190 lb (86.2 kg)   BMI 28.06 kg/m   Constitutional:  Alert and oriented, no acute distress, nontoxic appearing HEENT: [place], AT Cardiovascular: No clubbing, cyanosis, or edema Respiratory: Normal respiratory effort, no increased work of breathing GU: Bilateral descended testicles.  Left varicocele.  Right epididymis is slightly full and tender. Skin: No rashes, bruises or suspicious lesions Neurologic: Grossly intact, no focal deficits, moving all 4 extremities Psychiatric: Normal mood and affect  Laboratory Data: Results for orders placed or performed in visit on 06/09/23  Microscopic Examination   Collection Time: 06/09/23 10:12 AM   Urine  Result Value Ref Range   WBC, UA 0-5 0 - 5 /hpf   RBC, Urine 0-2 0 - 2 /hpf   Epithelial Cells (non renal) None seen 0 - 10 /hpf   Bacteria, UA None seen None seen/Few  Urinalysis, Complete   Collection Time: 06/09/23 10:12 AM  Result Value Ref Range   Specific Gravity, UA <1.005 (L) 1.005 - 1.030   pH, UA 6.0 5.0 - 7.5   Color, UA Yellow Yellow   Appearance Ur Clear Clear   Leukocytes,UA Negative Negative   Protein,UA Negative Negative/Trace   Glucose, UA Negative Negative   Ketones, UA Negative Negative   RBC, UA Trace (A) Negative   Bilirubin, UA Negative Negative   Urobilinogen, Ur 0.2 0.2 - 1.0 mg/dL   Nitrite, UA Negative Negative   Microscopic Examination See below:    Assessment & Plan:   1. Right testicular pain (Primary) Right testicular/inguinal pain and hematospermia x 3 weeks in the setting of riding a power roller suspicious for inflammatory prostatitis flare, however with a slightly enlarged and tender right epididymis, I would like to go ahead and treat him for possible infectious epididymitis as well.  Will have him increase Flomax  to twice daily, start daily Celebrex, and treat with 10 days of empiric Bactrim and have him follow-up with me if his symptoms do not resolve. - Urinalysis, Complete -  tamsulosin  (FLOMAX ) 0.4 MG CAPS capsule; Take 2 capsules (0.8 mg total) by mouth daily as needed (prostatitis).  Dispense: 28 capsule; Refill: 2 - celecoxib (CELEBREX) 100 MG capsule; Take 1 capsule (100 mg total) by mouth 2 (two) times daily for 14 days.  Dispense: 28 capsule; Refill: 0 - sulfamethoxazole-trimethoprim (BACTRIM DS) 800-160 MG tablet; Take 1 tablet by mouth 2 (two) times daily for 10 days.  Dispense: 20 tablet; Refill: 0  2. Hematospermia Suspect secondary to inflammatory prostatitis as above.  3. Microscopic hematuria Noted on UA today, will continue to monitor.  4. Benign prostatic hyperplasia with weak urinary stream Chronic obstructive voiding symptoms relieved with daily Flomax  consistent with BPH.  We discussed that he can remain on Flomax  chronically.  Will continue to monitor. - tamsulosin  (FLOMAX ) 0.4 MG CAPS capsule; Take 1 capsule (0.4 mg total) by mouth  daily.  Dispense: 90 capsule; Refill: 3   Return in about 1 year (around 06/08/2024) for Annual IPSS/PVR/UA.  Kathreen Pare, PA-C  [facility] [place] [address], [address] [place], [place] [address] [phone]

## 2023-08-12 DIAGNOSIS — G4733 Obstructive sleep apnea (adult) (pediatric): Secondary | ICD-10-CM | POA: Diagnosis not present

## 2023-11-10 ENCOUNTER — Encounter: Payer: Self-pay | Admitting: Psychiatry

## 2023-11-10 ENCOUNTER — Ambulatory Visit (INDEPENDENT_AMBULATORY_CARE_PROVIDER_SITE_OTHER): Admitting: Psychiatry

## 2023-11-10 ENCOUNTER — Other Ambulatory Visit: Payer: Self-pay

## 2023-11-10 VITALS — BP 138/87 | HR 58 | Temp 97.4°F | Ht 69.0 in | Wt 198.0 lb

## 2023-11-10 DIAGNOSIS — F411 Generalized anxiety disorder: Secondary | ICD-10-CM

## 2023-11-10 DIAGNOSIS — F3342 Major depressive disorder, recurrent, in full remission: Secondary | ICD-10-CM | POA: Diagnosis not present

## 2023-11-10 NOTE — Progress Notes (Signed)
 [facility] MD OP Progress Note  11/10/2023 9:08 AM Derrick [name]  MRN:  [id number]  Chief Complaint:  Chief Complaint  Patient presents with   Follow-up   Anxiety   Depression   Medication Refill   Discussed the use of AI scribe software for clinical note transcription with the patient, who gave verbal consent to proceed.  History of Present Illness Derrick [name] is a 53 year old Caucasian male, married, retired Tax inspector, currently employed part-time, lives in [place], has a history of GAD, MDD, obstructive sleep apnea on CPAP, prostatitis, hematospermia, BPH, nephrolithiasis was evaluated in office today for a follow-up appointment.  Ongoing anxiety and worry, primarily focused on concerns for his son and daughter-in-law related to their jobs and recent land purchase issues, continue to affect him. He describes improved management of these worries compared to when he first started treatment, crediting both faith-based coping strategies and medication for this progress. Staying busy with work serves as a helpful coping mechanism, allowing him to prevent excessive worry. He continues to work 2 jobs and feels that maintaining a busy schedule supports his mental well-being.  He currently takes nortriptyline  25 mg daily and finds it helpful for his symptoms. Hydroxyzine  remains available as needed for anxiety, but he has not needed to take it recently.   He reports that sleep remains good with continued use of his CPAP, and he describes his appetite as good, making healthy choices. Although he does not currently see a therapist, he previously completed several virtual sessions with a therapist in [facility], which he found beneficial. He and the therapist mutually agreed to leave the option open for future sessions if needed.  Retired and currently working 2 jobs. Staying busy remains important to him. Regular engagement in prayer and faith serves as a foundation for his  well-being.     Visit Diagnosis:    ICD-10-CM   1. Generalized anxiety disorder  F41.1     2. Recurrent major depressive disorder, in full remission  F33.42       Past Psychiatric History: I have reviewed past psychiatric history from progress note on 05/30/2022.  Past trials of medications like Celexa , Paxil, Wellbutrin, clonazepam   Past Medical History:  Past Medical History:  Diagnosis Date   Anxiety    Depression    GERD (gastroesophageal reflux disease)    History of kidney stones    Renal disorder    kidney stones   Sleep apnea     Past Surgical History:  Procedure Laterality Date   CYSTOSCOPY/URETEROSCOPY/HOLMIUM LASER/STENT PLACEMENT Left 01/14/2018   Procedure: CYSTOSCOPY/URETEROSCOPY/HOLMIUM LASER/STENT PLACEMENT;  Surgeon: Penne Knee, MD;  Location: [facility] ORS;  Service: Urology;  Laterality: Left;   VASECTOMY      Family Psychiatric History: Reviewed family psychiatric history from progress note on 05/30/2022.  Family History:  Family History  Problem Relation Age of Onset   Dementia Father    Dementia Paternal Aunt    Parkinsonism Maternal Grandfather    Dementia Paternal Grandmother    Renal cancer Neg Hx    Prostate cancer Neg Hx     Social History: I have reviewed social history from progress note on 05/30/2022. Social History   Socioeconomic History   Marital status: Married    Spouse name: Not on file   Number of children: 1   Years of education: Not on file   Highest education level: Associate degree: academic program  Occupational History   Occupation: Retired IT sales professional now part-time with  [employer] in [facility]   Occupation: Emergency management    Employer: [employer]    Comment: Part Time  Tobacco Use   Smoking status: Never    Passive exposure: Never   Smokeless tobacco: Current    Types: Snuff  Vaping Use   Vaping status: Never Used  Substance and Sexual Activity   Alcohol use: Yes    Comment: occasionally    Drug use: Not Currently    Types: Marijuana   Sexual activity: Yes    Birth control/protection: None  Other Topics Concern   Not on file  Social History Narrative   Not on file   Social Drivers of Health   Financial Resource Strain: Low Risk  (01/24/2023)   Received from [facility]   Overall Financial Resource Strain (CARDIA)    Difficulty of Paying Living Expenses: Not hard at all  Food Insecurity: No Food Insecurity (01/24/2023)   Received from [facility]   Hunger Vital Sign    Within the past 12 months, you worried that your food would run out before you got the money to buy more.: Never true    Within the past 12 months, the food you bought just didn't last and you didn't have money to get more.: Never true  Transportation Needs: No Transportation Needs (01/24/2023)   Received from [facility]    In the past 12 months, has lack of transportation kept you from medical appointments or from getting medications?: No    Lack of Transportation (Non-Medical): No  Physical Activity: Not on file  Stress: Not on file  Social Connections: Not on file    Allergies: No Known Allergies  Metabolic Disorder Labs: No results found for: HGBA1C, MPG No results found for: PROLACTIN Lab Results  Component Value Date   CHOL 182 03/10/2023   TRIG 61 03/10/2023   HDL 63 03/10/2023   CHOLHDL 2.9 03/10/2023   LDLCALC 107 (H) 03/10/2023   LDLCALC 88 03/18/2022   Lab Results  Component Value Date   TSH 0.861 03/10/2023   TSH 0.668 03/18/2022    Therapeutic Level Labs: No results found for: LITHIUM No results found for: VALPROATE No results found for: CBMZ  Current Medications: Current Outpatient Medications  Medication Sig Dispense Refill   ANUCORT-HC 25 MG suppository Place 25 mg rectally 2 (two) times daily.     famotidine  (PEPCID ) 20 MG tablet Take 20 mg by mouth as needed.      hydrocortisone (ANUSOL-HC) 2.5 % rectal cream Apply topically.     hydrOXYzine  (VISTARIL ) 25 MG capsule Take 2 capsules (50 mg total) by mouth every 8 (eight) hours as needed for anxiety. 30 capsule 0   nortriptyline  (PAMELOR ) 25 MG capsule Take 1 capsule (25 mg total) by mouth at bedtime. 90 capsule 3   psyllium (METAMUCIL) 58.6 % powder Take 1 packet by mouth daily.     tamsulosin  (FLOMAX ) 0.4 MG CAPS capsule Take 1 capsule (0.4 mg total) by mouth daily. (Patient taking differently: Take 0.4 mg by mouth 2 (two) times daily as needed.) 90 capsule 3   No current facility-administered medications for this visit.     Musculoskeletal: Strength & Muscle Tone: within normal limits Gait & Station: normal Patient leans: N/A  Psychiatric Specialty Exam: Review of Systems  Psychiatric/Behavioral: Negative.      Blood pressure 138/87, pulse (!) 58, temperature (!) 97.4 F (36.3 C), temperature source Temporal, height 5'  9 (1.753 m), weight 198 lb (89.8 kg).Body mass index is 29.24 kg/m.  General Appearance: Casual  Eye Contact:  Good  Speech:  Clear and Coherent  Volume:  Normal  Mood:  Euthymic  Affect:  Appropriate  Thought Process:  Goal Directed and Descriptions of Associations: Intact  Orientation:  Full (Time, Place, and Person)  Thought Content: Logical   Suicidal Thoughts:  No  Homicidal Thoughts:  No  Memory:  Immediate;   Fair Recent;   Fair Remote;   Fair  Judgement:  Fair  Insight:  Fair  Psychomotor Activity:  Normal  Concentration:  Concentration: Fair and Attention Span: Fair  Recall:  [employer] of Knowledge: Fair  Language: Fair  Akathisia:  No  Handed:  Right  AIMS (if indicated): not done  Assets:  Communication Skills Desire for Improvement Housing Social Support Transportation  ADL's:  Intact  Cognition: WNL  Sleep:  Fair   Screenings: GAD-7    Garment/textile technologist Visit from 11/10/2023 in [facility]  Office Visit from 11/25/2022 in [facility] Office Visit from 09/23/2022 in [facility] Office Visit from 05/30/2022 in [facility]  Total GAD-7 Score 6 1 6 10    PHQ2-9    Flowsheet Row Office Visit from 11/10/2023 in [facility] Video Visit from 06/02/2023 in [facility] Office Visit from 11/25/2022 in [facility] Office Visit from 09/23/2022 in [facility] Office Visit from 05/30/2022 in [facility]  PHQ-2 Total Score 2 0 0 2 3  PHQ-9 Total Score 3 -- 0 3 11   Flowsheet Row Office Visit from 11/10/2023 in [facility] Video Visit from 06/02/2023 in [facility] Video Visit from 02/25/2023 in [facility]  C-SSRS RISK CATEGORY No Risk No Risk No Risk     Assessment and Plan: Derrick [name] is a 53 year old Caucasian male, currently stable on current medication regimen.  Discussed assessment and plan as noted below.  1. Generalized anxiety disorder-stable Currently reports anxiety symptoms is well-managed on the current medication regimen. Continue Nortriptyline  25 mg at bedtime Continue Hydroxyzine  25 mg as needed for severe anxiety attacks.   2. Recurrent major depressive disorder, in full remission Currently denies any significant depression symptoms Continue Nortriptyline  25 mg at bedtime Continue psychotherapy sessions as needed.  Previously followed up with Mr. Zulema at [facility]  Follow-up Follow-up in clinic in 5 months or sooner if needed.   Consent: Patient/Guardian gives verbal consent for treatment and assignment of benefits for services  provided during this visit. Patient/Guardian expressed understanding and agreed to proceed.   This note was generated in part or whole with voice recognition software. Voice recognition is usually quite accurate but there are transcription errors that can and very often do occur. I apologize for any typographical errors that were not detected and corrected.    Tanyia Grabbe, MD 11/10/2023, 9:08 AM

## 2023-12-01 ENCOUNTER — Ambulatory Visit: Payer: Self-pay

## 2023-12-08 DIAGNOSIS — Z86006 Personal history of melanoma in-situ: Secondary | ICD-10-CM | POA: Diagnosis not present

## 2023-12-08 DIAGNOSIS — D485 Neoplasm of uncertain behavior of skin: Secondary | ICD-10-CM | POA: Diagnosis not present

## 2023-12-08 DIAGNOSIS — D2272 Melanocytic nevi of left lower limb, including hip: Secondary | ICD-10-CM | POA: Diagnosis not present

## 2023-12-08 DIAGNOSIS — D2261 Melanocytic nevi of right upper limb, including shoulder: Secondary | ICD-10-CM | POA: Diagnosis not present

## 2023-12-08 DIAGNOSIS — D2262 Melanocytic nevi of left upper limb, including shoulder: Secondary | ICD-10-CM | POA: Diagnosis not present

## 2023-12-08 DIAGNOSIS — D0462 Carcinoma in situ of skin of left upper limb, including shoulder: Secondary | ICD-10-CM | POA: Diagnosis not present

## 2023-12-08 DIAGNOSIS — Z85820 Personal history of malignant melanoma of skin: Secondary | ICD-10-CM | POA: Diagnosis not present

## 2023-12-08 DIAGNOSIS — D225 Melanocytic nevi of trunk: Secondary | ICD-10-CM | POA: Diagnosis not present

## 2023-12-19 DIAGNOSIS — D0462 Carcinoma in situ of skin of left upper limb, including shoulder: Secondary | ICD-10-CM | POA: Diagnosis not present

## 2024-02-27 ENCOUNTER — Ambulatory Visit: Payer: Self-pay

## 2024-02-27 DIAGNOSIS — Z0000 Encounter for general adult medical examination without abnormal findings: Secondary | ICD-10-CM

## 2024-02-27 LAB — POCT URINALYSIS DIPSTICK
Bilirubin, UA: NEGATIVE
Glucose, UA: NEGATIVE
Ketones, UA: NEGATIVE
Leukocytes, UA: NEGATIVE
Nitrite, UA: NEGATIVE
Protein, UA: NEGATIVE
Spec Grav, UA: <=1.005 — AB
Urobilinogen, UA: 0.2 U/dL
pH, UA: 6.5

## 2024-02-27 NOTE — Progress Notes (Signed)
 Pt completed all requirements for scheduled physical. Derrick [name]

## 2024-02-28 LAB — CMP12+LP+TP+TSH+6AC+PSA+CBC…
ALT: 18 [IU]/L (ref 0–44)
AST: 21 [IU]/L (ref 0–40)
Albumin: 4.5 g/dL (ref 3.8–4.9)
Alkaline Phosphatase: 92 [IU]/L (ref 47–123)
BUN/Creatinine Ratio: 9 (ref 9–20)
BUN: 9 mg/dL (ref 6–24)
Basophils Absolute: 0.1 10*3/uL (ref 0.0–0.2)
Basos: 1 %
Bilirubin Total: 0.9 mg/dL (ref 0.0–1.2)
Calcium: 8.8 mg/dL (ref 8.7–10.2)
Chloride: 103 mmol/L (ref 96–106)
Chol/HDL Ratio: 2.9 ratio (ref 0.0–5.0)
Cholesterol, Total: 168 mg/dL (ref 100–199)
Creatinine, Ser: 0.96 mg/dL (ref 0.76–1.27)
EOS (ABSOLUTE): 0.4 10*3/uL (ref 0.0–0.4)
Eos: 8 %
Free Thyroxine Index: 2 (ref 1.2–4.9)
GGT: 21 [IU]/L (ref 0–65)
Globulin, Total: 2.6 g/dL (ref 1.5–4.5)
Glucose: 97 mg/dL (ref 70–99)
HDL: 58 mg/dL
Hematocrit: 49.2 % (ref 37.5–51.0)
Hemoglobin: 16.9 g/dL (ref 13.0–17.7)
Immature Grans (Abs): 0.1 10*3/uL (ref 0.0–0.1)
Immature Granulocytes: 1 %
Iron: 158 ug/dL (ref 38–169)
LDH: 154 [IU]/L (ref 121–224)
LDL Chol Calc (NIH): 99 mg/dL (ref 0–99)
Lymphocytes Absolute: 1.5 10*3/uL (ref 0.7–3.1)
Lymphs: 28 %
MCH: 32.6 pg (ref 26.6–33.0)
MCHC: 34.3 g/dL (ref 31.5–35.7)
MCV: 95 fL (ref 79–97)
Monocytes Absolute: 0.6 10*3/uL (ref 0.1–0.9)
Monocytes: 11 %
Neutrophils Absolute: 2.7 10*3/uL (ref 1.4–7.0)
Neutrophils: 51 %
Phosphorus: 2.9 mg/dL (ref 2.8–4.1)
Platelets: 261 10*3/uL (ref 150–450)
Potassium: 4.5 mmol/L (ref 3.5–5.2)
Prostate Specific Ag, Serum: 0.9 ng/mL (ref 0.0–4.0)
RBC: 5.18 x10E6/uL (ref 4.14–5.80)
RDW: 11.7 % (ref 11.6–15.4)
Sodium: 140 mmol/L (ref 134–144)
T3 Uptake Ratio: 31 % (ref 24–39)
T4, Total: 6.3 ug/dL (ref 4.5–12.0)
TSH: 1.19 u[IU]/mL (ref 0.450–4.500)
Total Protein: 7.1 g/dL (ref 6.0–8.5)
Triglycerides: 52 mg/dL (ref 0–149)
Uric Acid: 3.9 mg/dL (ref 3.8–8.4)
VLDL Cholesterol Cal: 11 mg/dL (ref 5–40)
WBC: 5.4 10*3/uL (ref 3.4–10.8)
eGFR: 94 mL/min/{1.73_m2}

## 2024-03-08 ENCOUNTER — Encounter: Payer: 59 | Admitting: Physician Assistant

## 2024-04-12 ENCOUNTER — Telehealth: Admitting: Psychiatry

## 2024-06-08 ENCOUNTER — Ambulatory Visit: Admitting: Physician Assistant
